# Patient Record
Sex: Female | Born: 1990 | Race: Black or African American | Hispanic: No | Marital: Single
Health system: Midwestern US, Community
[De-identification: ages and names within clinical notes are randomized; demographics above are authoritative.]

## PROBLEM LIST (undated history)

## (undated) DIAGNOSIS — R51 Headache: Secondary | ICD-10-CM

## (undated) DIAGNOSIS — R519 Headache, unspecified: Secondary | ICD-10-CM

## (undated) DIAGNOSIS — D649 Anemia, unspecified: Secondary | ICD-10-CM

## (undated) DIAGNOSIS — M2012 Hallux valgus (acquired), left foot: Secondary | ICD-10-CM

## (undated) DIAGNOSIS — M8970 Major osseous defect, unspecified site: Secondary | ICD-10-CM

## (undated) DIAGNOSIS — M2011 Hallux valgus (acquired), right foot: Secondary | ICD-10-CM

## (undated) DIAGNOSIS — O26893 Other specified pregnancy related conditions, third trimester: Secondary | ICD-10-CM

## (undated) DIAGNOSIS — O9A213 Injury, poisoning and certain other consequences of external causes complicating pregnancy, third trimester: Secondary | ICD-10-CM

## (undated) HISTORY — DX: Headache: R51

## (undated) HISTORY — DX: Headache, unspecified: R51.9

## (undated) MED ORDER — CEPHALEXIN 500 MG CAP
500 mg | ORAL_CAPSULE | Freq: Three times a day (TID) | ORAL | Status: AC
Start: ? — End: 2012-09-14

## (undated) MED ORDER — NAPROXEN 500 MG TAB
500 mg | ORAL_TABLET | Freq: Two times a day (BID) | ORAL | Status: AC
Start: ? — End: 2013-11-24

---
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
## 2011-10-07 MED ORDER — AZITHROMYCIN 250 MG TAB
250 mg | ORAL_TABLET | ORAL | Status: AC
Start: 2011-10-07 — End: 2011-10-12

## 2011-10-07 NOTE — ED Notes (Signed)
Pt reports sore throat for 2 weeks

## 2011-10-07 NOTE — ED Provider Notes (Addendum)
HPI Comments: 20 y.o. female presents ambulatory to ED C/O sore throat. Pt reports sore throat x 4 days which is worse with swallowing. Pt rates her pain as 6/10. Pt notes daughter has cold like sx's. Pt denies F/C, N/V/D, or cough.    PCP: Ricky Stabs, MD   Social Hx: -tobacco, -etoh  NKDA    There are no other complaints, changes or physical findings at this time.   Written by Joan Flores, ED Scribe, as dictated by Gillermina Phy.     The history is provided by the patient.        No past medical history on file.     No past surgical history on file.      Family History   Problem Relation Age of Onset   ??? Hypertension Mother    ??? Hypertension Maternal Grandfather         History     Social History   ??? Marital Status: Single     Spouse Name: N/A     Number of Children: N/A   ??? Years of Education: N/A     Occupational History   ??? Not on file.     Social History Main Topics   ??? Smoking status: Never Smoker    ??? Smokeless tobacco: Never Used   ??? Alcohol Use: No   ??? Drug Use: No   ??? Sexually Active: No     Other Topics Concern   ??? Not on file     Social History Narrative   ??? No narrative on file                  ALLERGIES: Review of patient's allergies indicates no known allergies.      Review of Systems   Constitutional: Negative.  Negative for fever and chills.   HENT: Positive for sore throat. Negative for congestion and rhinorrhea.    Eyes: Negative.    Respiratory: Negative.  Negative for cough and shortness of breath.    Cardiovascular: Negative.  Negative for chest pain.   Gastrointestinal: Negative.  Negative for nausea, vomiting and diarrhea.   Genitourinary: Negative.    Musculoskeletal: Negative.    Skin: Negative.  Negative for rash.   Neurological: Negative.    All other systems reviewed and are negative.        Filed Vitals:    10/07/11 1029   BP: 137/90   Pulse: 87   Temp: 97.6 ??F (36.4 ??C)   Resp: 16   Height: 5\' 2"  (1.575 m)   Weight: 69.8 kg (153 lb 14.1 oz)   SpO2: 100%             Physical Exam   Nursing note and vitals reviewed.  Constitutional: She is oriented to person, place, and time. She appears well-developed and well-nourished. No distress.   HENT:   Head: Normocephalic and atraumatic.   Right Ear: External ear normal.   Left Ear: External ear normal.   Nose: Nose normal.   Mouth/Throat: Posterior oropharyngeal erythema present. No oropharyngeal exudate.   Eyes: Conjunctivae and EOM are normal. Pupils are equal, round, and reactive to light. Right eye exhibits no discharge. Left eye exhibits no discharge. No scleral icterus.   Neck: Normal range of motion. Neck supple. No JVD present. No tracheal deviation present.   Cardiovascular: Normal rate, regular rhythm, normal heart sounds and intact distal pulses.  Exam reveals no gallop and no friction rub.    No  murmur heard.  Pulmonary/Chest: Effort normal and breath sounds normal. No respiratory distress. She has no wheezes. She has no rales. She exhibits no tenderness.   Abdominal: Soft. Bowel sounds are normal. She exhibits no distension and no mass. There is no tenderness. There is no rebound and no guarding.   Musculoskeletal: Normal range of motion. She exhibits no edema and no tenderness.   Lymphadenopathy:     She has no cervical adenopathy.   Neurological: She is alert and oriented to person, place, and time. She has normal reflexes. No cranial nerve deficit. She exhibits normal muscle tone. Coordination normal.   Skin: Skin is warm and dry. She is not diaphoretic.   Psychiatric: She has a normal mood and affect. Her behavior is normal. Judgment and thought content normal.    Written by Joan Flores, ED Scribe, as dictated by Gillermina Phy.     MDM     Progress:   Patient progress:  Stable      Procedures    10:37 AM    Pt has no new complaints, changes or physical findings. Care plan outlined and precautions discussed. All available results were reviewed with pt. All medications were reviewed with pt. Pt Rx'd Zithromax. All of pt's questions and concerns were addressed. Pt agrees to F/U as instructed with PCP and agrees to return to ED upon further deterioration. Pt is ready to go home.  Written by Joan Flores, ED Scribe, as dictated by Gillermina Phy.      I was personally available for consultation in the emergency department.  I have reviewed the chart and agree with the documentation recorded by the [facility], including the assessment, treatment plan, and disposition.  Julianne Handler, MD

## 2011-11-16 LAB — URINALYSIS W/MICROSCOPIC
Bilirubin: NEGATIVE
Blood: NEGATIVE
Glucose: NEGATIVE MG/DL
Ketone: NEGATIVE MG/DL
Nitrites: NEGATIVE
Specific gravity: 1.026 (ref 1.003–1.030)
Urobilinogen: 1 EU/DL (ref 0.2–1.0)
pH (UA): 8 (ref 5.0–8.0)

## 2011-11-16 LAB — HCG URINE, QL: HCG urine, QL: POSITIVE — AB

## 2011-11-16 MED ORDER — PROMETHAZINE 25 MG TAB
25 mg | ORAL_TABLET | Freq: Four times a day (QID) | ORAL | Status: DC | PRN
Start: 2011-11-16 — End: 2012-09-07

## 2011-11-16 MED ORDER — NITROFURANTOIN (25% MACROCRYSTAL FORM) 100 MG CAP
100 mg | ORAL_CAPSULE | Freq: Two times a day (BID) | ORAL | Status: AC
Start: 2011-11-16 — End: 2011-11-21

## 2011-11-16 NOTE — ED Provider Notes (Signed)
The history is provided by the patient. No language interpreter was used.      Pt is 20 yof ambulatory to ER with c/o nausea ongoing x [redacted] week along with h/a. Pt states "I have been feeling sick for a week". Pt denies vomiting or diarrhea. Pt denies fever/chills, cough,URI s/s,  urinary s/s, back pain, ABD cramping, vag bleeding, vag odor, vag d/c.     Pt states has taken no HCG test. Pt prior to today: G3 P1 A2 (elective abortions with most recent 8/12). Pt states LMP x 2 months ago.     Pt has + prenatal vits at home but not recently taking them .    Pt with no other c/o or concerns today. Pain is 0/10 scale.     Pt PMD none   OB /GYN: Dr Montez Hageman    Pt with NKA  Written by Mickey Farber, ED Scribe, as dictated by PA Cleavenger.        No past medical history on file.     No past surgical history on file.      Family History   Problem Relation Age of Onset   ??? Hypertension Mother    ??? Hypertension Maternal Grandfather         History     Social History   ??? Marital Status: SINGLE     Spouse Name: N/A     Number of Children: N/A   ??? Years of Education: N/A     Occupational History   ??? Not on file.     Social History Main Topics   ??? Smoking status: Never Smoker    ??? Smokeless tobacco: Never Used   ??? Alcohol Use: No   ??? Drug Use: No   ??? Sexually Active: No     Other Topics Concern   ??? Not on file     Social History Narrative   ??? No narrative on file                  ALLERGIES: Review of patient's allergies indicates no known allergies.      Review of Systems   Constitutional: Negative for fever and chills.        Well hydrated, in no apparent distress   HENT: Negative for ear pain, sore throat and rhinorrhea.    Eyes: Negative.    Respiratory: Negative for cough.    Cardiovascular: Negative.    Gastrointestinal: Positive for nausea. Negative for vomiting, abdominal pain and diarrhea.   Genitourinary: Negative for dysuria, vaginal bleeding and vaginal discharge.        Denies vag odor   Musculoskeletal: Negative for back  pain.   Skin:        + skin intact   Neurological:        + h/a, + A & O X 3 during exam    All other systems reviewed and are negative.        Filed Vitals:    11/16/11 1000   BP: 140/76   Pulse: 91   Temp: 98.2 ??F (36.8 ??C)   Resp: 18   Height: 5\' 2"  (1.575 m)   Weight: 68.312 kg (150 lb 9.6 oz)   SpO2: 100%            Physical Exam   Nursing note and vitals reviewed.  Constitutional: She is oriented to person, place, and time. She appears well-developed and well-nourished. No distress.  AA female   HENT:   Head: Normocephalic and atraumatic.   Eyes: EOM are normal. Pupils are equal, round, and reactive to light.   Neck: Normal range of motion. Neck supple.   Cardiovascular: Normal rate, regular rhythm, normal heart sounds and intact distal pulses.  Exam reveals no friction rub.    No murmur heard.  Pulmonary/Chest: Effort normal and breath sounds normal. No respiratory distress. She has no wheezes. She has no rales. She exhibits no tenderness.   Abdominal: Soft. Bowel sounds are normal. She exhibits no distension. There is no tenderness. There is no rebound and no guarding.        No CVAT   Musculoskeletal: Normal range of motion. She exhibits no edema and no tenderness.   Neurological: She is alert and oriented to person, place, and time. She exhibits normal muscle tone. Coordination normal.   Skin: Skin is warm and dry. She is not diaphoretic. No pallor.   Psychiatric: She has a normal mood and affect. Her behavior is normal.        MDM     Differential Diagnosis; Clinical Impression; Plan:     Pregnancy, gastritis, UTI  Amount and/or Complexity of Data Reviewed:   Clinical lab tests:  Ordered and reviewed  Tests in the medicine section of the CPT??:  Reviewed and ordered  Progress:   Patient progress:  Stable      Procedures    12:29 PM  Pt has been reexamined. Pt is feeling improved. Lab results reviewed with pt. RX meds explained to pt. Pt advised to f/u with Dr Montez Hageman x 2 days and to begin prenatal  vits. Pt questions answered. Pt counseled re: dx, tx, rx and f/u with understanding Pt is ready to go home.  Written by Mickey Farber, ED Scribe, as dictated by PA Cleavenger.

## 2011-11-16 NOTE — ED Notes (Signed)
PA at bedside.

## 2011-11-16 NOTE — ED Notes (Signed)
Assumed care of pt.  Assessment completed.  Pt placed in position of comfort.  Call bell in reach.  Pt reports nausea x more than 1 week.  Pt states unsure of when last menstrual period was.  Had an abortion in August 2012 and had a period after that.

## 2011-11-16 NOTE — ED Notes (Signed)
Adrian Cleavenger, PA reviewed discharge instructions with the patient.  The patient verbalized understanding.  Pt discharged via ambulation.  Instructions in hand.  Instructions given by Adrian Cleavenger, PA.

## 2011-11-17 NOTE — ED Provider Notes (Signed)
I was personally available for consultation in the emergency department.  I have reviewed the chart and agree with the documentation recorded by the MLP, including the assessment, treatment plan, and disposition.  Allura Doepke A Azka Steger, MD

## 2012-09-07 LAB — URINALYSIS W/ REFLEX CULTURE
Bilirubin: NEGATIVE
Glucose: NEGATIVE MG/DL
Ketone: NEGATIVE MG/DL
Nitrites: NEGATIVE
Protein: NEGATIVE MG/DL
Specific gravity: 1.012 (ref 1.003–1.030)
Urobilinogen: 0.2 EU/DL (ref 0.2–1.0)
pH (UA): 6.5 (ref 5.0–8.0)

## 2012-09-07 LAB — HCG URINE, QL. - POC: Pregnancy test,urine (POC): POSITIVE — AB

## 2012-09-07 NOTE — ED Notes (Signed)
Urine obtained and sent to lab ,POC Preg is +

## 2012-09-07 NOTE — ED Provider Notes (Signed)
HPI Comments: Amanda Little is a 21 y.o. female who presents ambulatory to [facility] ED with cc of request for pregnancy test. Pt reports her LNMP x 3 months and notes associated moderate BL "cramping" low abd pain. Pt denies visiting a OBGYN regularly and has not called an OBGYN regarding current sxs. Pt is unsure of who delivered her daughter. Pt notes she took 3 pregnancy tests at home, 1 positive and 2 negative. Pt denies vaginal bleeding, vaginal dicharge, problems with urine function or possibility of STD.     PCP: None    PMhx is significant for: Pt denies   Social hx: - Smoke, - EtOH, - Illicit Drugs    There are no other complaints, changes or physical findings at this time. 9:04 AM   Written by Sherlyn Lees, ED Scribe, as dictated by Annita Brod.        The history is provided by the patient.        History reviewed. No pertinent past medical history.     History reviewed. No pertinent past surgical history.      Family History   Problem Relation Age of Onset   ??? Hypertension Mother    ??? Hypertension Maternal Grandfather         History     Social History   ??? Marital Status: SINGLE     Spouse Name: N/A     Number of Children: N/A   ??? Years of Education: N/A     Occupational History   ??? Not on file.     Social History Main Topics   ??? Smoking status: Never Smoker    ??? Smokeless tobacco: Never Used   ??? Alcohol Use: No   ??? Drug Use: No   ??? Sexually Active: No     Other Topics Concern   ??? Not on file     Social History Narrative   ??? No narrative on file                  ALLERGIES: Review of patient's allergies indicates no known allergies.      Review of Systems   Constitutional: Negative.    HENT: Negative.    Eyes: Negative.    Respiratory: Negative.    Cardiovascular: Negative.    Gastrointestinal: Positive for abdominal pain (BL low).   Genitourinary: Negative.  Negative for dysuria, frequency, hematuria, vaginal bleeding, vaginal discharge and difficulty urinating.   Musculoskeletal: Negative.     Skin: Negative.    Neurological: Negative.    Hematological: Negative.    Psychiatric/Behavioral: Negative.    All other systems reviewed and are negative.        Filed Vitals:    09/07/12 0852   BP: 129/79   Pulse: 114   Temp: 98.3 ??F (36.8 ??C)   Resp: 18   Height: 5\' 2"  (1.575 m)   Weight: 66.5 kg (146 lb 9.7 oz)   SpO2: 100%            Physical Exam   Nursing note and vitals reviewed.  Constitutional: She is oriented to person, place, and time. She appears well-developed and well-nourished. No distress.   HENT:   Head: Normocephalic and atraumatic.   Right Ear: External ear normal.   Left Ear: External ear normal.   Nose: Nose normal.   Mouth/Throat: Oropharynx is clear and moist. No oropharyngeal exudate.   Eyes: Conjunctivae and EOM are normal. Pupils are equal, round, and reactive to light.  Right eye exhibits no discharge. Left eye exhibits no discharge. No scleral icterus.   Neck: Normal range of motion. Neck supple. No JVD present. No tracheal deviation present.   Cardiovascular: Normal rate, regular rhythm, normal heart sounds and intact distal pulses.  Exam reveals no gallop and no friction rub.    No murmur heard.  Pulmonary/Chest: Effort normal and breath sounds normal. No respiratory distress. She has no wheezes. She has no rales. She exhibits no tenderness.   Abdominal: Soft. Bowel sounds are normal. She exhibits no distension and no mass. There is no tenderness. There is no rigidity, no rebound and no guarding.   Musculoskeletal: Normal range of motion. She exhibits no edema and no tenderness.   Lymphadenopathy:     She has no cervical adenopathy.   Neurological: She is alert and oriented to person, place, and time. She has normal reflexes. No cranial nerve deficit. She exhibits normal muscle tone. Coordination normal.   Skin: Skin is warm and dry. She is not diaphoretic.   Psychiatric: She has a normal mood and affect. Her behavior is normal. Judgment and thought content normal.    Written by  Andreas Blower, ED Scribe, as dictated by Annita Brod.        MDM     Differential Diagnosis; Clinical Impression; Plan:     DDx: Pregnancy, Amenorrhea, UTI   Amount and/or Complexity of Data Reviewed:   Clinical lab tests:  Reviewed and ordered   Review and summarize past medical records:  Yes  Progress:   Patient progress:  Stable      Procedures    LABORATORY TESTS:  Recent Results (from the past 12 hour(s))   URINALYSIS W/ REFLEX CULTURE    Collection Time    09/07/12  9:30 AM       Result Value Range    Color YELLOW/STRAW      Appearance CLEAR  CLEAR    Specific gravity 1.012  1.003 - 1.030      pH 6.5  5.0 - 8.0      Protein NEGATIVE   NEGATIVE MG/DL    Glucose NEGATIVE   NEGATIVE MG/DL    Ketone NEGATIVE   NEGATIVE MG/DL    Bilirubin NEGATIVE   NEGATIVE    Blood TRACE (*) NEGATIVE    Urobilinogen 0.2  0.2 - 1.0 EU/DL    Nitrites NEGATIVE   NEGATIVE    Leukocyte Esterase SMALL (*) NEGATIVE    WBC 5-10  0 - 4 /HPF    RBC 0-5  0 - 5 /HPF    Epithelial cells MODERATE (*) FEW /LPF    Bacteria 1+ (*) NEGATIVE /HPF    UA:UC IF INDICATED URINE CULTURE ORDERED (*) CULTURE NOT INDICATE   HCG URINE, QL. - POC    Collection Time    09/07/12  9:48 AM       Result Value Range    Pregnancy test,urine (POC) POSITIVE (*) NEGATIVE       MEDICATIONS GIVEN:  Medications - No data to display    IMPRESSION:  1. Pregnancy    2. UTI (urinary tract infection)        PLAN:  1. Keflex  2. F/u with OBGYN  Return to ED if worse     DISCHARGE NOTE  10:30 AM    Brand Males is ready for discharge. The pt's signs/symptoms, results, diagnosis and discharge instructions have been discussed w/ the pt and/or available family members. The pt conveys understanding  and agreement with the diagnosis and plan. There are no new physical findings, changes or complaints at this time. Pt is recommended to f/u with OBGYN or return to ED if condition worsens. Will discharge w/ Keflex.  Written by Andreas Blower, ED Scribe, as dictated by  Annita Brod.

## 2012-09-07 NOTE — Other (Signed)
PAC reviewed discharge instructions with the patient.  The patient verbalized understanding.

## 2012-09-07 NOTE — ED Notes (Signed)
Pt A&Ox4 with discharge, pt ambulatory out with discharge with no complications. No further distress noted with discharge.

## 2012-09-07 NOTE — ED Notes (Signed)
Patient has been given discharge instructions by PA and patient was given the opportunity to ask questions.  Nurse also reinforced discharge instructions and patient has verbalized understanding.

## 2012-09-07 NOTE — ED Provider Notes (Signed)
I was personally available for consultation in the emergency department.  I have reviewed the chart and agree with the documentation recorded by the MLP, including the assessment, treatment plan, and disposition.  Bascom Biel, MD

## 2012-09-08 LAB — CULTURE, URINE
Colonies Counted: <10000
Colony Count: <10000
Culture result:: NO GROWTH
Culture: NO GROWTH

## 2013-03-14 MED ORDER — OFLOXACIN 0.3 % EAR DROPS
0.3 % | Freq: Every day | OTIC | Status: DC
Start: 2013-03-14 — End: 2014-04-13

## 2013-03-14 NOTE — ED Notes (Signed)
Assumed care of patient ambulatory from waiting room.  Patient reports cough cold congestion x 1 week with 3 day hx of decreased hearing from R ear. Denies any fever/chills Reports nausea  Patient alert and oriented x 4 Skin warm dry intact  Patient in POC on stretcher Updated on plan with pending evaluation by PA/NP

## 2013-03-14 NOTE — ED Notes (Signed)
Patient discharged by NP Page. Patient provided with discharge instructions Rx and instructions on follow up care. Patient out of ED under own power with steady gait accompanied by self.

## 2013-03-14 NOTE — ED Provider Notes (Addendum)
HPI Comments: 22 y.o. female presents ambulatory to the ED c/o sinus congestion and drainage x 1 week. Pt reports rhinorrhea and states she has tx'd her symptoms with Dayquil and Mucinex with little relief. Pt reports her symptoms are worse in the morning after waking and states she has a congested cough in the morning. Pt denies SOB, wheezing Fever or Chills. Pt denies modifying factors. Pt also reports fullness in her R ear. Pt denies h/o allergies. Pt denies abd pain, N/V/D, HA, rash. Pt denies pain associated with symptoms.     PCP: None  PMHx: pt denies   PSHx: pt denies   Social Hx: non smoker     There are no other complaints, changes or physical findings at this time.   Written by Kathlene Cote, ED Scribe, as dictated by Mani Bales?? FNP-BC        The history is provided by the patient. No language interpreter was used.        History reviewed. No pertinent past medical history.     History reviewed. No pertinent past surgical history.      Family History   Problem Relation Age of Onset   ??? Hypertension Mother    ??? Hypertension Maternal Grandfather         History     Social History   ??? Marital Status: SINGLE     Spouse Name: N/A     Number of Children: N/A   ??? Years of Education: N/A     Occupational History   ??? Not on file.     Social History Main Topics   ??? Smoking status: Never Smoker    ??? Smokeless tobacco: Never Used   ??? Alcohol Use: No   ??? Drug Use: No   ??? Sexually Active: No     Other Topics Concern   ??? Not on file     Social History Narrative   ??? No narrative on file                  ALLERGIES: Review of patient's allergies indicates no known allergies.      Review of Systems   Constitutional: Negative.  Negative for fever and chills.   HENT: Positive for ear pain (see hpi, R ear fullness), congestion, rhinorrhea, sneezing and sinus pressure. Negative for sore throat.    Eyes: Negative.    Respiratory: Positive for cough. Negative for chest tightness, shortness of breath and wheezing.     Cardiovascular: Negative.    Gastrointestinal: Negative.  Negative for nausea, vomiting, abdominal pain and diarrhea.   Endocrine: Negative.    Genitourinary: Negative.    Musculoskeletal: Negative.  Negative for myalgias and back pain.   Skin: Negative.  Negative for rash and wound.   Allergic/Immunologic: Negative.  Negative for environmental allergies.   Neurological: Negative.    All other systems reviewed and are negative.        Filed Vitals:    03/14/13 1308   BP: 134/91   Pulse: 86   Temp: 98.2 ??F (36.8 ??C)   Resp: 18   Height: 5\' 2"  (1.575 m)   Weight: 65.7 kg (144 lb 13.5 oz)   SpO2: 97%            Physical Exam   Nursing note and vitals reviewed.  Constitutional: She is oriented to person, place, and time. Vital signs are normal. She appears well-developed and well-nourished.  Non-toxic appearance. She does not have a sickly appearance.  She does not appear ill. No distress.   HENT:   Head: Normocephalic and atraumatic.   Mouth/Throat: No oropharyngeal exudate.   L TM is dove gray and canal is unobstructed. R ear canal is impacted with cerumen. Pale turbinates BL. Pale mucous membranes in oropharynx.    Eyes: Conjunctivae, EOM and lids are normal. Pupils are equal, round, and reactive to light.   Neck: Trachea normal, normal range of motion and full passive range of motion without pain. Neck supple.   Cardiovascular: Normal rate, regular rhythm, normal heart sounds and normal pulses.    Pulmonary/Chest: Effort normal and breath sounds normal. She has no wheezes. She has no rales.   Abdominal: Soft. Normal appearance and bowel sounds are normal.   Musculoskeletal: Normal range of motion.   Neurological: She is alert and oriented to person, place, and time. She has normal strength. GCS eye subscore is 4. GCS verbal subscore is 5. GCS motor subscore is 6.   Skin: Skin is warm, dry and intact.   Psychiatric: She has a normal mood and affect. Her speech is normal and behavior is normal. Judgment and thought  content normal. Cognition and memory are normal.   Written by Kathlene Cote, ED Scribe, dictated by Raso Bales?? FNP-BC      MDM     Differential Diagnosis; Clinical Impression; Plan:     DDx: otitis media, otitis externa, viral illness, seasonal allergies   Amount and/or Complexity of Data Reviewed:    Decide to obtain previous medical records or to obtain history from someone other than the patient:  No   Obtain history from someone other than the patient:  No   Review and summarize past medical records:  No   Discuss the patient with another provider:  No   Independant visualization of image, tracing, or specimen:  No  Risk of Significant Complications, Morbidity, and/or Mortality:   Presenting problems:  Minimal  Diagnostic procedures:  Minimal  Management options:  Minimal  Progress:   Patient progress:  Stable      Procedures    Progress Note:  2:55 PM  Pt re-evaluated. The RN was able to irrigate the the pt's R ear canal and clear the cerumen. The TM appears bulging with no erythema. .   Written by Kathlene Cote, ED Scribe, dictated by Caisse Bales?? FNP-BC      IMPRESSION:  1. Ear pain, right    2. Otitis externa    3. Nasal congestion        PLAN:  1. F/u with a PCP or local clinic   2. Rx: Floxin   Return to ED if worse       3:00 PM  Amanda Little's  results have been reviewed with her.  She has been counseled regarding her diagnosis.  She verbally conveys understanding and agreement of the signs, symptoms, diagnosis, treatment and prognosis and additionally agrees to follow up as recommended.  She also agrees with the care-plan and conveys that all of her questions have been answered.  I have also put together some discharge instructions for her that include: 1) educational information regarding their diagnosis, 2) how to care for their diagnosis at home, as well a 3) list of reasons why they would want to return to the ED prior to their follow-up appointment, should their condition change.  Written  by Kathlene Cote, ED Scribe, dictated by Kettles Bales?? FNP-BC    I was personally available for consultation in  the emergency department.  I have reviewed the chart and agree with the documentation recorded by the [facility], including the assessment, treatment plan, and disposition.  Alden Benjamin, MD

## 2013-03-14 NOTE — ED Notes (Signed)
NP Page bedside evaluating patient.

## 2013-03-14 NOTE — ED Notes (Signed)
Flushed the patients right eat with saline, removed a large amount of waxy debris

## 2013-11-14 NOTE — ED Provider Notes (Addendum)
HPI Comments: Amanda Little is a 23 y.o. female presenting ambulatory to ED c/o 5/10 throbbing R wrist pain with bruising since yesterday secondary to injury. Pt reports exacerbation of sxs with movement. Pt states that she hit her wrist on a dresser while moving. Pt reports applying ice and taking Tylenol with no relief of sxs. Pt denies chance of pregnancy. Pt specifically denies numbness, CP, SOB, ABD pain, or N/V/D.     PMHx Significant For: Pt denies  PSHx Significant For: Pt denies  Social Hx: - tobacco (never), - EtOH, - illicit drugs      There are no other complaints, changes or physical findings at this time.     Written by Philis Nettle Inamdar, ED Scribe, as dictated by Dede Query, PA-C.     The history is provided by the patient.        History reviewed. No pertinent past medical history.     History reviewed. No pertinent past surgical history.      Family History   Problem Relation Age of Onset   ??? Hypertension Mother    ??? Hypertension Maternal Grandfather         History     Social History   ??? Marital Status: SINGLE     Spouse Name: N/A     Number of Children: N/A   ??? Years of Education: N/A     Occupational History   ??? Not on file.     Social History Main Topics   ??? Smoking status: Never Smoker    ??? Smokeless tobacco: Never Used   ??? Alcohol Use: No   ??? Drug Use: No   ??? Sexually Active: No     Other Topics Concern   ??? Not on file     Social History Narrative   ??? No narrative on file                  ALLERGIES: Review of patient's allergies indicates no known allergies.      Review of Systems   Constitutional: Negative.  Negative for fever and chills.   HENT: Negative.  Negative for ear pain, congestion, sore throat and rhinorrhea.    Eyes: Negative.  Negative for visual disturbance.   Respiratory: Negative.  Negative for cough, chest tightness, shortness of breath and wheezing.    Cardiovascular: Negative.  Negative for chest pain and palpitations.   Gastrointestinal: Negative.  Negative for nausea,  vomiting, abdominal pain, diarrhea and constipation.   Endocrine: Negative.    Genitourinary: Negative.  Negative for dysuria and hematuria.   Musculoskeletal: Positive for arthralgias. Negative for myalgias.   Skin: Positive for color change (bruising). Negative for rash.   Allergic/Immunologic: Negative.  Negative for environmental allergies and food allergies.   Neurological: Negative.  Negative for seizures and headaches.   Hematological: Negative.    Psychiatric/Behavioral: Negative.    All other systems reviewed and are negative.        Filed Vitals:    11/14/13 1845 11/14/13 2016   BP: 130/92 137/88   Pulse: 99 92   Temp: 98.3 ??F (36.8 ??C)    Resp: 18 16   Height: 5\' 2"  (1.575 m)    Weight: 65.2 kg (143 lb 11.8 oz)    SpO2: 100% 97%            Physical Exam   Nursing note and vitals reviewed.  Constitutional: She is oriented to person, place, and time. She appears well-developed and well-nourished.  No distress.   Pt appears well, sitting on bed, awake and alert in NAD.    HENT:   Head: Normocephalic and atraumatic.   Right Ear: Tympanic membrane, external ear and ear canal normal.   Left Ear: Tympanic membrane, external ear and ear canal normal.   Nose: Nose normal.   Mouth/Throat: Uvula is midline, oropharynx is clear and moist and mucous membranes are normal. No oropharyngeal exudate, posterior oropharyngeal edema or posterior oropharyngeal erythema.   Eyes: Conjunctivae and EOM are normal. Pupils are equal, round, and reactive to light. Right eye exhibits no discharge. Left eye exhibits no discharge. No scleral icterus.   Neck: Normal range of motion. Neck supple. No JVD present. No tracheal deviation present.   Cardiovascular: Normal rate, regular rhythm, normal heart sounds and intact distal pulses.  Exam reveals no gallop and no friction rub.    No murmur heard.  Pulses:       Radial pulses are 2+ on the right side, and 2+ on the left side.   2+ radial pulses b/l.  Good cap refills less than 2 seconds    Pulmonary/Chest: Effort normal and breath sounds normal. No stridor. No respiratory distress. She has no wheezes. She has no rales. She exhibits no tenderness.   Abdominal: Soft. Bowel sounds are normal. She exhibits no distension. There is no tenderness. There is no guarding.   No CVA tenderness b/l.   Musculoskeletal: Normal range of motion. She exhibits tenderness. She exhibits no edema.        Right wrist: She exhibits tenderness (over anterior surface). She exhibits normal range of motion.        Right hand: She exhibits tenderness (of anterior 5th metacarpal).   R wrist: + bruising over anterior wrist. No edema, erythema, or obvious bony deformity. + TTP over bruising. ROM grossly intact.  R hand: No edema, erythema, or obvious bony deformity. + TTP over anterior 5th metacarpal. ROM grossly intact.   Lymphadenopathy:     She has no cervical adenopathy.   Neurological: She is alert and oriented to person, place, and time. No cranial nerve deficit. She exhibits normal muscle tone. Coordination normal.   Sensation and neuro intact. Grip strength 5/5 of upper extremity b/l.   Skin: Skin is warm and dry. Bruising (over anterior surface of R wrist) noted. No rash noted. She is not diaphoretic. No erythema. No pallor.   Psychiatric: She has a normal mood and affect. Her behavior is normal. Judgment and thought content normal.   Written by Nirja A. Inamdar, ED Scribe, as dictated by Dede Query, PA-C.       MDM     Differential Diagnosis; Clinical Impression; Plan:     DDx: fx, sprain, strain, contusion  Amount and/or Complexity of Data Reviewed:   Tests in the radiology section of CPT??:  Ordered and reviewed   Review and summarize past medical records:  Yes   Independant visualization of image, tracing, or specimen:  Yes  Progress:   Patient progress:  Stable and improved      Procedures    Procedure Note - ACE Wrap Placement:  8:13 PM  Performed by: Dede Query, PA-C  Neurovascularly intact prior to tx.  An ACE  wrap bandage was placed on pt's right wrist.  Joint was placed in neutral position.  Neurovascularly intact after tx.   The procedure took 1-15 minutes, and pt tolerated well.  Written by Philis Nettle Inamdar, ED Scribe, as dictated by Dede Query,  PA-C.    IMAGING RESULTS:     XR WRIST RT AP/LAT/OBL (Final result)  Result time: 11/14/13 19:57:47      Final result by Rad Results In Edi (11/14/13 19:57:47)      Narrative:    **Final Report**      ICD Codes / Adm.Diagnosis: 140016 / Wrist Pain hand and wrist pain  Examination: CR WRIST MIN 3 VWS RT - [id number] - Nov 14 2013 7:43PM  Accession No: [id number]  Reason: injury      REPORT:  EXAM: CR WRIST MIN 3 VWS RT    INDICATION: Right wrist injury    COMPARISON: None.    FINDINGS: 4 views of the right wrist demonstrate no fracture. The soft   tissues are within normal limits.       IMPRESSION: No fracture detected.          Signing/Reading Doctor: Trilby LeaverBOBBETTE L. NEWSOME [phone]([id number]   Approved: Trilby LeaverBOBBETTE L. NEWSOME [phone]([id number]) Nov 14 2013 7:55PM                         XR HAND RT MIN 3 V (Final result)  Result time: 11/14/13 19:52:26      Final result by Rad Results In Edi (11/14/13 19:52:26)      Narrative:    **Final Report**      ICD Codes / Adm.Diagnosis: 140016 / Wrist Pain hand and wrist pain  Examination: CR HAND MIN 3 VWS RT - [id number] - Nov 14 2013 7:43PM  Accession No: [id number]  Reason: injury      REPORT:  EXAM: CR HAND MIN 3 VWS RT    INDICATION: Right hand injury    COMPARISON: None.    FINDINGS: Three views of the right hand demonstrate no fracture or   dislocation. The soft tissues are within normal limits.       IMPRESSION: No fracture detected.          Signing/Reading Doctor: Trilby LeaverBOBBETTE L. NEWSOME [phone]([id number]   Approved: Trilby LeaverBOBBETTE L. NEWSOME [phone]([id number]) Nov 14 2013 7:50PM        MEDICATIONS GIVEN:  Medications   oxyCODONE-acetaminophen (PERCOCET) 5-325 mg per tablet 1 tablet (1 tablet Oral Given 11/14/13 2014)       IMPRESSION:  1. Contusion of wrist, right    2. Wrist pain    3. Hand  pain        PLAN:   1. F/u with Dr. Logan BoresEvans in 4 days or PCP as needed  2. Rx Naprosyn  3. Rest, ice, compression, and elevation  4. Return to ED if worse     DISCHARGE NOTE:  8:15 PM   The patient is ready for discharge. The patient's signs, symptoms, diagnosis, and discharge instructions have been discussed and the patient and/or family has conveyed their understanding. The patient and/or family is to follow up as recommended or return to the ER should their symptoms worsen. Plan has been discussed and the patient and/or family is in agreement.   Written by Philis NettleNirja A. Inamdar, ED Scribe, as dictated by Dede QueryLacey Tudor, PA-C.

## 2013-11-14 NOTE — ED Notes (Signed)
PA Tudor reviewed discharge instructions with the patient.  The patient verbalized understanding. The patient was ambulatory out of ED in stable condition.

## 2013-11-14 NOTE — ED Notes (Signed)
Pt complain of pain to her right wrist after hitting while moving a dresser yesterday. Pt complain of pain 5/10. Pt took tylenol; with little relief.

## 2013-11-15 MED ORDER — OXYCODONE-ACETAMINOPHEN 5 MG-325 MG TAB
5-325 mg | ORAL | Status: AC
Start: 2013-11-15 — End: 2013-11-14
  Administered 2013-11-15: 01:00:00 via ORAL

## 2013-11-15 NOTE — ED Provider Notes (Signed)
I was personally available for consultation in the emergency department.  I have reviewed the chart and agree with the documentation recorded by the MLP, including the assessment, treatment plan, and disposition.  Antoniette Peake, MD

## 2014-03-24 LAB — RPR, EXTERNAL: RPR, External: NONREACTIVE

## 2014-03-24 LAB — GLUCOSE, GESTATIONAL, 1 HR TOLERANCE, EXTERNAL: Antibody screen, External: NEGATIVE

## 2014-03-24 LAB — RUBELLA AB, IGG, EXTERNAL: Rubella, External: IMMUNE

## 2014-03-24 LAB — HIV-1 AB, EXTERNAL
HIV, EXTERNAL: NONREACTIVE
HIV, External: NONREACTIVE

## 2014-03-24 LAB — HEPATITIS B SURFACE AG, EXTERNAL: HBsAg, External: NEGATIVE

## 2014-03-24 LAB — TYPE, ABO & RH, EXTERNAL
ABO,Rh: A POS
TYPE, ABO & RH, EXTERNAL: A POS

## 2014-04-13 LAB — CBC WITH AUTOMATED DIFF
ABS. BASOPHILS: 0 10*3/uL (ref 0.0–0.1)
ABS. EOSINOPHILS: 0 10*3/uL (ref 0.0–0.4)
ABS. LYMPHOCYTES: 1.7 10*3/uL (ref 0.8–3.5)
ABS. MONOCYTES: 0.4 10*3/uL (ref 0.0–1.0)
ABS. NEUTROPHILS: 6 10*3/uL (ref 1.8–8.0)
BASOPHILS: 0 % (ref 0–1)
EOSINOPHILS: 0 % (ref 0–7)
HCT: 36.8 % (ref 35.0–47.0)
HGB: 12.8 g/dL (ref 11.5–16.0)
LYMPHOCYTES: 21 % (ref 12–49)
MCH: 29.7 PG (ref 26.0–34.0)
MCHC: 34.8 g/dL (ref 30.0–36.5)
MCV: 85.4 FL (ref 80.0–99.0)
MONOCYTES: 5 % (ref 5–13)
NEUTROPHILS: 74 % (ref 32–75)
PLATELET: 251 10*3/uL (ref 150–400)
RBC: 4.31 M/uL (ref 3.80–5.20)
RDW: 12.9 % (ref 11.5–14.5)
WBC: 8.2 10*3/uL (ref 3.6–11.0)

## 2014-04-13 LAB — GLUCOSE, POC: Glucose (POC): 91 mg/dL (ref 65–100)

## 2014-04-13 LAB — URINALYSIS W/ REFLEX CULTURE
Blood: NEGATIVE
Glucose: NEGATIVE mg/dL
Ketone: 80 mg/dL — AB
Nitrites: NEGATIVE
Protein: 30 mg/dL — AB
Specific gravity: 1.015 (ref 1.003–1.030)
Urobilinogen: 1 EU/dL (ref 0.2–1.0)
pH (UA): 7.5 (ref 5.0–8.0)

## 2014-04-13 LAB — METABOLIC PANEL, COMPREHENSIVE
A-G Ratio: 0.9 — ABNORMAL LOW (ref 1.1–2.2)
ALT (SGPT): 39 U/L (ref 12–78)
AST (SGOT): 19 U/L (ref 15–37)
Albumin: 3.7 g/dL (ref 3.5–5.0)
Alk. phosphatase: 56 U/L (ref 45–117)
Anion gap: 7 mmol/L (ref 5–15)
BUN/Creatinine ratio: 7 — ABNORMAL LOW (ref 12–20)
BUN: 5 MG/DL — ABNORMAL LOW (ref 6–20)
Bilirubin, total: 0.6 MG/DL (ref 0.2–1.0)
CO2: 27 mmol/L (ref 21–32)
Calcium: 8.9 MG/DL (ref 8.5–10.1)
Chloride: 101 mmol/L (ref 97–108)
Creatinine: 0.75 MG/DL (ref 0.45–1.15)
GFR est AA: >60 mL/min/{1.73_m2} (ref >60)
GFR est non-AA: >60 mL/min/{1.73_m2} (ref >60)
Globulin: 4.1 g/dL — ABNORMAL HIGH (ref 2.0–4.0)
Glucose: 76 mg/dL (ref 65–100)
Potassium: 3.3 mmol/L — ABNORMAL LOW (ref 3.5–5.1)
Protein, total: 7.8 g/dL (ref 6.4–8.2)
Sodium: 135 mmol/L — ABNORMAL LOW (ref 136–145)

## 2014-04-13 LAB — BILIRUBIN, CONFIRM: Bilirubin UA, confirm: NEGATIVE

## 2014-04-13 LAB — LIPASE: Lipase: 92 U/L (ref 73–393)

## 2014-04-13 MED ORDER — CEPHALEXIN 500 MG CAP
500 mg | ORAL_CAPSULE | Freq: Three times a day (TID) | ORAL | Status: AC
Start: 2014-04-13 — End: 2014-04-20

## 2014-04-13 MED ORDER — FAMOTIDINE (PF) 20 MG/2 ML IV
20 mg/2 mL | INTRAVENOUS | Status: AC
Start: 2014-04-13 — End: 2014-04-13
  Administered 2014-04-13: 18:00:00 via INTRAVENOUS

## 2014-04-13 MED ORDER — ONDANSETRON 4 MG TAB, RAPID DISSOLVE
4 mg | ORAL_TABLET | Freq: Three times a day (TID) | ORAL | Status: DC | PRN
Start: 2014-04-13 — End: 2014-10-05

## 2014-04-13 MED ORDER — ONDANSETRON (PF) 4 MG/2 ML INJECTION
4 mg/2 mL | INTRAMUSCULAR | Status: AC
Start: 2014-04-13 — End: 2014-04-13
  Administered 2014-04-13: 17:00:00 via INTRAVENOUS

## 2014-04-13 MED ORDER — SODIUM CHLORIDE 0.9% BOLUS IV
0.9 % | INTRAVENOUS | Status: AC
Start: 2014-04-13 — End: 2014-04-13
  Administered 2014-04-13: 19:00:00 via INTRAVENOUS

## 2014-04-13 MED ORDER — CEPHALEXIN 500 MG CAP
500 mg | ORAL_CAPSULE | Freq: Three times a day (TID) | ORAL | Status: DC
Start: 2014-04-13 — End: 2014-04-13

## 2014-04-13 MED ORDER — CEFTRIAXONE 1 GRAM SOLUTION FOR INJECTION
1 gram | INTRAMUSCULAR | Status: AC
Start: 2014-04-13 — End: 2014-04-13
  Administered 2014-04-13: 19:00:00 via INTRAVENOUS

## 2014-04-13 MED ORDER — ONDANSETRON 4 MG TAB, RAPID DISSOLVE
4 mg | ORAL_TABLET | Freq: Three times a day (TID) | ORAL | Status: DC | PRN
Start: 2014-04-13 — End: 2014-04-13

## 2014-04-13 MED ORDER — POTASSIUM CHLORIDE SR 10 MEQ TAB
10 mEq | ORAL | Status: AC
Start: 2014-04-13 — End: 2014-04-13
  Administered 2014-04-13: 19:00:00 via ORAL

## 2014-04-13 MED ORDER — PROMETHAZINE 25 MG RECTAL SUPPOSITORY
25 mg | Freq: Four times a day (QID) | RECTAL | Status: DC | PRN
Start: 2014-04-13 — End: 2014-10-05

## 2014-04-13 MED ORDER — PROMETHAZINE 25 MG RECTAL SUPPOSITORY
25 mg | Freq: Four times a day (QID) | RECTAL | Status: DC | PRN
Start: 2014-04-13 — End: 2014-04-13

## 2014-04-13 MED ORDER — ONDANSETRON (PF) 4 MG/2 ML INJECTION
4 mg/2 mL | INTRAMUSCULAR | Status: AC
Start: 2014-04-13 — End: 2014-04-13
  Administered 2014-04-13: 20:00:00 via INTRAVENOUS

## 2014-04-13 MED ADMIN — sodium chloride 0.9 % bolus infusion 1,000 mL: INTRAVENOUS | @ 17:00:00 | NDC 00409798309

## 2014-04-13 MED FILL — SODIUM CHLORIDE 0.9 % IV: INTRAVENOUS | Qty: 1000

## 2014-04-13 MED FILL — K-TAB 10 MEQ TABLET,EXTENDED RELEASE: 10 mEq | ORAL | Qty: 4

## 2014-04-13 MED FILL — CEFTRIAXONE 1 GRAM SOLUTION FOR INJECTION: 1 gram | INTRAMUSCULAR | Qty: 1

## 2014-04-13 MED FILL — FAMOTIDINE (PF) 20 MG/2 ML IV: 20 mg/2 mL | INTRAVENOUS | Qty: 2

## 2014-04-13 MED FILL — ONDANSETRON (PF) 4 MG/2 ML INJECTION: 4 mg/2 mL | INTRAMUSCULAR | Qty: 2

## 2014-04-13 NOTE — ED Notes (Signed)
Pt is having slight nausea and headache but she thinks she can try and keep applesauce down.

## 2014-04-13 NOTE — ED Provider Notes (Addendum)
HPI Comments: Amanda Little is a 23 y.o. female G7P1A (2-ab, 3-miss) approximaly [redacted] weeks pregnant presents to emergency room c/o multiple episodes of vomiting since pregnancy. She notes sx's are similar to previous hyperemesis which she experienced with her previous pregnancy. She has been taking prenatal vitamins and promethazine however states she vomited her promethazine this morning. She states 2 days ago she felt 'hot', stood up fast and had a syncopal episode and struck her head on the TV and awoke seconds later by father. She denies visual changes, numbness/tingling and states having a dull occipital HA "that isn't very bad" and not a pain source of discomfort for her.She states she called EMS today because she felt lightheaded with standing and weak and notes some 'blood streaks' in clear vomit. Her glucose was found to be 69 and she was given oral glucose en route to ED, she state she consumed about half the 15g tube, no hx of hypoglycemia in the past. She denies drinking alcohol, denies tobacco use, denies hx of gastritis / ulcer. She denies vaginal bleeding/spotting, F/C, diarrhea, cough, congestion, back pain, urinary / bowel complaints, abd pain, flank pain. She saw her OB last on 5/29 and had a confirmed IUP via [place] and states she was ~[redacted] weeks gestation.  LMP- 4/7 (approximate)    OB: Doreatha Martin, [facility]  PMH- none  Surgical hx- c-section  Social hx- denies tobacco, denies ETOH    The patient has no other complaints at this time.    Written by Chaya Jan, PA-C        Patient is a 23 y.o. female presenting with nausea. The history is provided by the patient.   Nausea   Pertinent negatives include no chills, no fever, no abdominal pain, no diarrhea and no myalgias.        No past medical history on file.     No past surgical history on file.      Family History   Problem Relation Age of Onset   ??? Hypertension Mother    ??? Hypertension Maternal Grandfather         History     Social  History   ??? Marital Status: SINGLE     Spouse Name: N/A     Number of Children: N/A   ??? Years of Education: N/A     Occupational History   ??? Not on file.     Social History Main Topics   ??? Smoking status: Never Smoker    ??? Smokeless tobacco: Never Used   ??? Alcohol Use: No   ??? Drug Use: No   ??? Sexual Activity: No     Other Topics Concern   ??? Not on file     Social History Narrative                  ALLERGIES: Review of patient's allergies indicates no known allergies.      Review of Systems   Constitutional: Positive for appetite change. Negative for fever and chills.   HENT: Negative.    Respiratory: Negative.    Cardiovascular: Negative.    Gastrointestinal: Positive for nausea and vomiting. Negative for abdominal pain, diarrhea and constipation.   Genitourinary: Negative.    Musculoskeletal: Negative.  Negative for myalgias, back pain and neck pain.   Skin: Negative for rash.   All other systems reviewed and are negative.      Filed Vitals:    04/13/14 1201 04/13/14 1258 04/13/14 1358  04/13/14 1428   BP: 129/75 117/68 127/74 106/77   Pulse: 72 83     Temp: 98.1 ??F (36.7 ??C)      Resp: 18      Height: '5\' 1"'  (1.549 m)      Weight: 58.968 kg (130 lb)      SpO2: 100%  100% 100%            Physical Exam   Constitutional: She is oriented to person, place, and time. She appears well-developed and well-nourished. She is active.  Non-toxic appearance. No distress.   Thin AA female, sitting upright in no distress   HENT:   Head: Normocephalic and atraumatic.   Right Ear: External ear normal.   Left Ear: External ear normal.   Nose: Nose normal.   Mouth/Throat: Oropharynx is clear and moist. No oropharyngeal exudate.   Eyes: Conjunctivae are normal.   Neck: Normal range of motion and full passive range of motion without pain. Neck supple. No tracheal tenderness present.   Cardiovascular: Normal rate, regular rhythm, S1 normal, S2 normal, normal heart sounds, intact distal pulses and normal pulses.  Exam reveals no gallop and  no friction rub.    No murmur heard.  Pulmonary/Chest: Effort normal and breath sounds normal. No respiratory distress. She has no wheezes. She has no rales.   Abdominal: Soft. Bowel sounds are normal. She exhibits no distension. There is no tenderness. There is no rebound and no guarding.   No CVAT   Musculoskeletal: Normal range of motion. She exhibits no edema or tenderness.   Neurological: She is alert and oriented to person, place, and time. She has normal strength. No cranial nerve deficit or sensory deficit. Coordination normal.   Skin: Skin is warm, dry and intact. No abrasion and no rash noted. She is not diaphoretic. No erythema.   Psychiatric: She has a normal mood and affect. Her speech is normal and behavior is normal. Cognition and memory are normal.   Nursing note and vitals reviewed.       MDM  Number of Diagnoses or Management Options  Acute cystitis without hematuria:   Hypokalemia:   Nausea with vomiting:   Diagnosis management comments: Ddx: hyperemesis gravidarum, UTI, pyelo, gastritis    Patient has no fever, no leukocytosis, is tolerating PO, appears well. Not orthostatic. Gave Rocephin in ED, 2L NS. No pelvic bleeding/pain/discharge or pelvic complaints. Has had confirmed IUP by OB. Patient tolerating chicken, broccoli and Kdur.  Chaya Jan, PA-C         Amount and/or Complexity of Data Reviewed  Clinical lab tests: ordered and reviewed  Review and summarize past medical records: yes    Patient Progress  Patient progress: stable      Procedures       Procedure Note - Limited Bedside Ultrasound- Pregnancy, FHT  3:49 PM  Performed by: Antonieta Pert, DO  Indication: Pregnancy  Interpretation:  Pt with Positive IUP noted on Transabdominal bedside US showing Positive fetal cardiac flicker.  The procedure took 1-15 minutes, and pt tolerated well.   Written by Anice Paganini, ED Scribe, as dictated by Antonieta Pert, DO.      DISCHARGE NOTE:  4:28 PM  The patient's results have been  reviewed with them and/or available family. Patient and/or family verbally conveyed their understanding and agreement of the patient's signs, symptoms, diagnosis, treatment and prognosis and additionally agree to follow up as recommended in the discharge instructions or to return to the  Emergency Room should their condition change prior to their follow-up appointment. The patient/family verbally agrees with the care-plan and verbally conveys that all of their questions have been answered. The discharge instructions have also been provided to the patient and/or family with some educational information regarding the patient's diagnosis as well a list of reasons why the patient would want to return to the ER prior to their follow-up appointment, should their condition change.  Written by Chaya Jan, PA-C    LABS COMPLETED AND REVIEWED:  Recent Results (from the past 12 hour(s))   METABOLIC PANEL, COMPREHENSIVE    Collection Time     04/13/14 12:52 PM       Result Value Ref Range    Sodium 135 (*) 136 - 145 mmol/L    Potassium 3.3 (*) 3.5 - 5.1 mmol/L    Chloride 101  97 - 108 mmol/L    CO2 27  21 - 32 mmol/L    Anion gap 7  5 - 15 mmol/L    Glucose 76  65 - 100 mg/dL    BUN 5 (*) 6 - 20 MG/DL    Creatinine 0.75  0.45 - 1.15 MG/DL    BUN/Creatinine ratio 7 (*) 12 - 20      GFR est AA >60  >60 ml/min/1.74m    GFR est non-AA >60  >60 ml/min/1.787m   Calcium 8.9  8.5 - 10.1 MG/DL    Bilirubin, total 0.6  0.2 - 1.0 MG/DL    ALT 39  12 - 78 U/L    AST 19  15 - 37 U/L    Alk. phosphatase 56  45 - 117 U/L    Protein, total 7.8  6.4 - 8.2 g/dL    Albumin 3.7  3.5 - 5.0 g/dL    Globulin 4.1 (*) 2.0 - 4.0 g/dL    A-G Ratio 0.9 (*) 1.1 - 2.2     CBC WITH AUTOMATED DIFF    Collection Time     04/13/14 12:52 PM       Result Value Ref Range    WBC 8.2  3.6 - 11.0 K/uL    RBC 4.31  3.80 - 5.20 M/uL    HGB 12.8  11.5 - 16.0 g/dL    HCT 36.8  35.0 - 47.0 %    MCV 85.4  80.0 - 99.0 FL    MCH 29.7  26.0 - 34.0 PG    MCHC 34.8   30.0 - 36.5 g/dL    RDW 12.9  11.5 - 14.5 %    PLATELET 251  150 - 400 K/uL    NEUTROPHILS 74  32 - 75 %    LYMPHOCYTES 21  12 - 49 %    MONOCYTES 5  5 - 13 %    EOSINOPHILS 0  0 - 7 %    BASOPHILS 0  0 - 1 %    ABS. NEUTROPHILS 6.0  1.8 - 8.0 K/UL    ABS. LYMPHOCYTES 1.7  0.8 - 3.5 K/UL    ABS. MONOCYTES 0.4  0.0 - 1.0 K/UL    ABS. EOSINOPHILS 0.0  0.0 - 0.4 K/UL    ABS. BASOPHILS 0.0  0.0 - 0.1 K/UL   LIPASE    Collection Time     04/13/14 12:52 PM       Result Value Ref Range    Lipase 92  73 - 393 U/L   GLUCOSE, POC    Collection Time  04/13/14  1:39 PM       Result Value Ref Range    Glucose (POC) 91  65 - 100 mg/dL    Performed by Lenore Manner "Lissa"     URINALYSIS W/ REFLEX CULTURE    Collection Time     04/13/14  1:41 PM       Result Value Ref Range    Color DARK YELLOW      Appearance CLOUDY (*) CLEAR      Specific gravity 1.015  1.003 - 1.030      pH (UA) 7.5  5.0 - 8.0      Protein 30 (*) NEG mg/dL    Glucose NEGATIVE   NEG mg/dL    Ketone 80 (*) NEG mg/dL    Blood NEGATIVE   NEG      Urobilinogen 1.0  0.2 - 1.0 EU/dL    Nitrites NEGATIVE   NEG      Leukocyte Esterase MODERATE (*) NEG      WBC 10-20  0 - 4 /hpf    RBC 5-10  0 - 5 /hpf    Epithelial cells MANY (*) FEW /lpf    Bacteria 2+ (*) NEG /hpf    UA:UC IF INDICATED URINE CULTURE ORDERED (*) CNI     BILIRUBIN, CONFIRM    Collection Time     04/13/14  1:41 PM       Result Value Ref Range    Bilirubin UA, confirm NEGATIVE   NEG           CLINICAL IMPRESSION:  1. Acute cystitis without hematuria    2. Nausea with vomiting    3. Hypokalemia        Plan:  1. Rocephin, Kdur, 2L NS in ED; passed PO challenge; Rx Keflex, Zofran ODT, Phenergan suppositories  2. OB f/u this week for re-check  3. Push liquids  4. Return to ER if worse    I was personally available for consultation in the emergency department.  I have reviewed the chart and agree with the documentation recorded by the [facility], including the assessment, treatment plan, and disposition.  Antonieta Pert, DO

## 2014-04-13 NOTE — ED Notes (Signed)
Pt has drank one entire 4 ounce of apple juice without nausea vomiting or dry heaves. Pt has a headache.

## 2014-04-13 NOTE — ED Notes (Signed)
bp lying and sitting and standing done

## 2014-04-13 NOTE — ED Notes (Signed)
Pt called on call bell , she incidentally noted that she thought she felt a lump in her right breast. She did want to have that evaluated.   Ryan called and notified Koleen Nimrod who came and examined pt. As I was taking IV out for discharge.  Pt was able to keep po apple juice and po ice water and some of the dinner down. Pt did not have either dry heaves nor vomiting while in ED.

## 2014-04-13 NOTE — ED Notes (Signed)
Still struggling with nausea. But still working on applesauce and taking po potassium pills. Iv antibiotic completed. Second liter is infusing.

## 2014-04-15 LAB — CULTURE, URINE
Colonies Counted: >100000
Colony Count: >100000

## 2014-05-16 LAB — CBC WITH AUTOMATED DIFF
ABS. BASOPHILS: 0 10*3/uL (ref 0.0–0.1)
ABS. EOSINOPHILS: 0.1 10*3/uL (ref 0.0–0.4)
ABS. LYMPHOCYTES: 2 10*3/uL (ref 0.8–3.5)
ABS. MONOCYTES: 0.4 10*3/uL (ref 0.0–1.0)
ABS. NEUTROPHILS: 6.2 10*3/uL (ref 1.8–8.0)
BASOPHILS: 0 % (ref 0–1)
EOSINOPHILS: 1 % (ref 0–7)
HCT: 37.1 % (ref 35.0–47.0)
HGB: 12.9 g/dL (ref 11.5–16.0)
LYMPHOCYTES: 23 % (ref 12–49)
MCH: 29.9 PG (ref 26.0–34.0)
MCHC: 34.8 g/dL (ref 30.0–36.5)
MCV: 86.1 FL (ref 80.0–99.0)
MONOCYTES: 5 % (ref 5–13)
NEUTROPHILS: 71 % (ref 32–75)
PLATELET: 295 10*3/uL (ref 150–400)
RBC: 4.31 M/uL (ref 3.80–5.20)
RDW: 13.1 % (ref 11.5–14.5)
WBC: 8.7 10*3/uL (ref 3.6–11.0)

## 2014-05-16 LAB — METABOLIC PANEL, COMPREHENSIVE
A-G Ratio: 0.8 — ABNORMAL LOW (ref 1.1–2.2)
ALT (SGPT): 14 U/L (ref 12–78)
AST (SGOT): 10 U/L — ABNORMAL LOW (ref 15–37)
Albumin: 3.4 g/dL — ABNORMAL LOW (ref 3.5–5.0)
Alk. phosphatase: 57 U/L (ref 45–117)
Anion gap: 7 mmol/L (ref 5–15)
BUN/Creatinine ratio: 10 — ABNORMAL LOW (ref 12–20)
BUN: 7 MG/DL (ref 6–20)
Bilirubin, total: 0.4 MG/DL (ref 0.2–1.0)
CO2: 25 mmol/L (ref 21–32)
Calcium: 9.3 MG/DL (ref 8.5–10.1)
Chloride: 103 mmol/L (ref 97–108)
Creatinine: 0.69 MG/DL (ref 0.45–1.15)
GFR est AA: >60 mL/min/{1.73_m2} (ref >60)
GFR est non-AA: >60 mL/min/{1.73_m2} (ref >60)
Globulin: 4.1 g/dL — ABNORMAL HIGH (ref 2.0–4.0)
Glucose: 106 mg/dL — ABNORMAL HIGH (ref 65–100)
Potassium: 3.8 mmol/L (ref 3.5–5.1)
Protein, total: 7.5 g/dL (ref 6.4–8.2)
Sodium: 135 mmol/L — ABNORMAL LOW (ref 136–145)

## 2014-05-16 LAB — URINALYSIS W/ REFLEX CULTURE
Blood: NEGATIVE
Glucose: NEGATIVE mg/dL
Ketone: NEGATIVE mg/dL
Nitrites: NEGATIVE
Specific gravity: 1.025 (ref 1.003–1.030)
Urobilinogen: 1 EU/dL (ref 0.2–1.0)
pH (UA): 7 (ref 5.0–8.0)

## 2014-05-16 LAB — WET PREP: Wet prep: NONE SEEN

## 2014-05-16 LAB — BILIRUBIN, CONFIRM: Bilirubin UA, confirm: NEGATIVE

## 2014-05-16 LAB — BETA HCG, QT
Beta HCG, QT: 47788 m[IU]/mL — ABNORMAL HIGH (ref 0–6)
hCG Quant: 47788 m[IU]/mL — ABNORMAL HIGH (ref 0–6)

## 2014-05-16 LAB — KOH, OTHER SOURCES: KOH: NONE SEEN

## 2014-05-16 LAB — HCG URINE, QL: HCG urine, QL: POSITIVE — AB

## 2014-05-16 MED ORDER — ACETAMINOPHEN 325 MG TABLET
325 mg | ORAL | Status: AC
Start: 2014-05-16 — End: 2014-05-16
  Administered 2014-05-16: 21:00:00 via ORAL

## 2014-05-16 MED ORDER — CEPHALEXIN 500 MG CAP
500 mg | ORAL_CAPSULE | Freq: Three times a day (TID) | ORAL | Status: AC
Start: 2014-05-16 — End: 2014-05-21

## 2014-05-16 MED FILL — MAPAP (ACETAMINOPHEN) 325 MG TABLET: 325 mg | ORAL | Qty: 2

## 2014-05-16 NOTE — ED Provider Notes (Signed)
HPI Comments: Amanda Little is a G59P1A3 23 y.o. female at approximately 12 wga who presents ambulatory to [facility] ED with cc of intermittent 7/10 lower abd cramping x 3 days. Pt states she was last evaluated by GYN last month and denies calling them regarding current sxs. Pt states she has been staying hydrated. Pt notes her LMP in April and her LBM was 4 days ago which is normal. Pt notes she is passing flatus. Pt denies taking any regular medications other than prenatal vitamins. Pt denies N/V/D, vaginal discharge, vaginal bleeding, dysuria or hematuria. Pt is taking PNV.    Allergies: NKDA    PCP: None (General)    PMhx is significant for: Abortion, miscarriage  PShx is significant for: Abortion   Social hx: - Tobacco, - EtOH, - Illicit Drugs    There are no other complaints, changes or physical findings at this time.  Written by Sylvan Cheese, ED Scribe, as dictated by Ocie Cornfield, MD.        The history is provided by the patient. No language interpreter was used.        No past medical history on file.     No past surgical history on file.      Family History   Problem Relation Age of Onset   ??? Hypertension Mother    ??? Hypertension Maternal Grandfather         History     Social History   ??? Marital Status: SINGLE     Spouse Name: N/A     Number of Children: N/A   ??? Years of Education: N/A     Occupational History   ??? Not on file.     Social History Main Topics   ??? Smoking status: Never Smoker    ??? Smokeless tobacco: Never Used   ??? Alcohol Use: No   ??? Drug Use: No   ??? Sexual Activity: No     Other Topics Concern   ??? Not on file     Social History Narrative                  ALLERGIES: Review of patient's allergies indicates no known allergies.      Review of Systems   Constitutional: Negative for fever, chills and fatigue.   HENT: Negative for congestion, rhinorrhea and sore throat.    Eyes: Negative for pain, discharge and visual disturbance.    Respiratory: Negative for cough, chest tightness, shortness of breath and wheezing.    Cardiovascular: Negative for chest pain, palpitations and leg swelling.   Gastrointestinal: Positive for abdominal pain. Negative for nausea, vomiting, diarrhea and constipation.   Genitourinary: Negative for dysuria, frequency, hematuria, vaginal bleeding and vaginal discharge.   Musculoskeletal: Negative for myalgias, back pain and arthralgias.   Skin: Negative for rash.   Neurological: Negative for dizziness, weakness, light-headedness and headaches.   Psychiatric/Behavioral: Negative.        Filed Vitals:    05/16/14 1459   BP: 130/85   Pulse: 115   Temp: 98 ??F (36.7 ??C)   Resp: 18   Height: '5\' 1"'  (1.549 m)   Weight: 57.2 kg (126 lb 1.7 oz)   SpO2: 98%            Physical Exam   Constitutional: She is oriented to person, place, and time. She appears well-developed and well-nourished. No distress.   HENT:   Little: Normocephalic and atraumatic.   Eyes: EOM are normal. Right eye exhibits  no discharge. Left eye exhibits no discharge. No scleral icterus.   Neck: Normal range of motion. Neck supple. No tracheal deviation present.   Cardiovascular: Normal rate, regular rhythm, normal heart sounds and intact distal pulses.  Exam reveals no gallop and no friction rub.    No murmur heard.  Pulmonary/Chest: Effort normal and breath sounds normal. No respiratory distress. She has no wheezes. She has no rales.   Abdominal: Soft. She exhibits no distension. There is tenderness (mild) in the suprapubic area.   Genitourinary:   White vaginal discharge. No adnexal masses or adnexal tenderness. No CMT. No blood in vaginal canal   Musculoskeletal: Normal range of motion. She exhibits no edema.   Lymphadenopathy:     She has no cervical adenopathy.   Neurological: She is alert and oriented to person, place, and time.   No focal neuro deficits   Skin: Skin is warm and dry. No rash noted.   Psychiatric: She has a normal mood and affect.    Nursing note and vitals reviewed.  Written by Sylvan Cheese, ED Scribe, as dictated by Ocie Cornfield, MD.    MDM  Number of Diagnoses or Management Options  BV (bacterial vaginosis):   Pelvic pain in pregnancy:   Diagnosis management comments:     Differential includes UTI, STI, PID, ovarian cyst.  Doubt threatened/inevitable abortion.  Abdominal exam is benign - do not suspect acute intraabdominal process.  - CBC, CMP  - UA  - Pelvic exam and labs  - Bedside US         Amount and/or Complexity of Data Reviewed  Clinical lab tests: ordered and reviewed  Review and summarize past medical records: yes  Independent visualization of images, tracings, or specimens: yes    Patient Progress  Patient progress: stable      Procedures    Procedure Note - Pelvic Exam:    6:05 PM  Performed by: Ocie Cornfield, MD  Pelvic exam was performed using bimanual and speculum. Further findings noted in physical exam.   The procedure took 1-15 minutes, and pt tolerated well.  Written by Ilean Skill, ED Scribe, as dictated by Ocie Cornfield, MD.    Procedure Note - Limited Bedside Ultrasound- Pregnancy, FHT  6:05 PM  Performed by: Rip Harbour, MD  Indication: Pregnancy  Interpretation:  Pt with Positive IUP noted on Transabdominal bedside US showing Positive FHT at 160, calculated by counting.  Fetal movement was seen.  The procedure took 1-15 minutes, and pt tolerated well.   Written by Rip Harbour, MD    PROGRESS NOTE:  7:20 PM  UA consistent with UTI - will treat with course of keflex.  Work up otherwise unremarkable.  Advised close follow up with ob/gyn.  Discussed results, prescriptions and follow up plan with patient.  Provided customary return to ED instructions.  Patient expressed understanding.  Rip Harbour, MD    LABORATORY TESTS:  Recent Results (from the past 12 hour(s))   METABOLIC PANEL, COMPREHENSIVE    Collection Time: 05/16/14  3:18 PM   Result Value Ref Range     Sodium 135 (L) 136 - 145 mmol/L    Potassium 3.8 3.5 - 5.1 mmol/L    Chloride 103 97 - 108 mmol/L    CO2 25 21 - 32 mmol/L    Anion gap 7 5 - 15 mmol/L    Glucose 106 (H) 65 - 100 mg/dL    BUN 7 6 - 20 MG/DL  Creatinine 0.69 0.45 - 1.15 MG/DL    BUN/Creatinine ratio 10 (L) 12 - 20      GFR est AA >60 >60 ml/min/1.63m    GFR est non-AA >60 >60 ml/min/1.71m   Calcium 9.3 8.5 - 10.1 MG/DL    Bilirubin, total 0.4 0.2 - 1.0 MG/DL    ALT 14 12 - 78 U/L    AST 10 (L) 15 - 37 U/L    Alk. phosphatase 57 45 - 117 U/L    Protein, total 7.5 6.4 - 8.2 g/dL    Albumin 3.4 (L) 3.5 - 5.0 g/dL    Globulin 4.1 (H) 2.0 - 4.0 g/dL    A-G Ratio 0.8 (L) 1.1 - 2.2     TOTAL HCG, QT.    Collection Time: 05/16/14  3:18 PM   Result Value Ref Range    HCG, [place]. [address] (H) 0 - 6 MIU/ML   CBC WITH AUTOMATED DIFF    Collection Time: 05/16/14  3:21 PM   Result Value Ref Range    WBC 8.7 3.6 - 11.0 K/uL    RBC 4.31 3.80 - 5.20 M/uL    HGB 12.9 11.5 - 16.0 g/dL    HCT 37.1 35.0 - 47.0 %    MCV 86.1 80.0 - 99.0 FL    MCH 29.9 26.0 - 34.0 PG    MCHC 34.8 30.0 - 36.5 g/dL    RDW 13.1 11.5 - 14.5 %    PLATELET 295 150 - 400 K/uL    NEUTROPHILS 71 32 - 75 %    LYMPHOCYTES 23 12 - 49 %    MONOCYTES 5 5 - 13 %    EOSINOPHILS 1 0 - 7 %    BASOPHILS 0 0 - 1 %    ABS. NEUTROPHILS 6.2 1.8 - 8.0 K/UL    ABS. LYMPHOCYTES 2.0 0.8 - 3.5 K/UL    ABS. MONOCYTES 0.4 0.0 - 1.0 K/UL    ABS. EOSINOPHILS 0.1 0.0 - 0.4 K/UL    ABS. BASOPHILS 0.0 0.0 - 0.1 K/UL   URINALYSIS W/ REFLEX CULTURE    Collection Time: 05/16/14  4:28 PM   Result Value Ref Range    Color DARK YELLOW      Appearance TURBID (A) CLEAR      Specific gravity 1.025 1.003 - 1.030      pH (UA) 7.0 5.0 - 8.0      Protein TRACE (A) NEG mg/dL    Glucose NEGATIVE  NEG mg/dL    Ketone NEGATIVE  NEG mg/dL    Blood NEGATIVE  NEG      Urobilinogen 1.0 0.2 - 1.0 EU/dL    Nitrites NEGATIVE  NEG      Leukocyte Esterase MODERATE (A) NEG      WBC 5-10 0 - 4 /hpf    RBC 0-5 0 - 5 /hpf     Epithelial cells MANY (A) FEW /lpf    Bacteria 1+ (A) NEG /hpf    UA:UC IF INDICATED URINE CULTURE ORDERED (A) CNI      Amorphous Crystals 3+ (A) NEG   HCG URINE, QL    Collection Time: 05/16/14  4:28 PM   Result Value Ref Range    HCG urine, Ql. POSITIVE (A) NEG     BILIRUBIN, CONFIRM    Collection Time: 05/16/14  4:28 PM   Result Value Ref Range    Bilirubin UA, confirm NEGATIVE  NEG     KOH, OTHER SOURCES  Collection Time: 05/16/14  6:01 PM   Result Value Ref Range    Special Requests: NO SPECIAL REQUESTS      KOH NO YEAST SEEN     WET PREP    Collection Time: 05/16/14  6:01 PM   Result Value Ref Range    Clue cells CLUE CELLS PRESENT      Wet prep NO TRICHOMONAS SEEN           MEDICATIONS GIVEN:  Medications   acetaminophen (TYLENOL) tablet 650 mg (650 mg Oral Given 05/16/14 1717)       IMPRESSION:  1. Pelvic pain in pregnancy    2. BV (bacterial vaginosis)        PLAN:  1. Discharge with RX Keflex  2.F/U with Dr. Doreene Nest  Return to ED if worse       Discharge Note  7:24 PM    The patient has been re-evaluated and is ready for discharge. Reviewed available results with pt. and available family. Counseled pt on diagnosis and plan of care. Pt expressed understanding and all questions have been answered. Pt agrees with plan and agrees to f/u as recommended or return to the ED if sxs worsen. Discharge instructions have been provided and explained to the pt, along with reasons to return to the ED.  Written by Ilean Skill, ED scribe, as dictated by Ocie Cornfield, MD.

## 2014-05-16 NOTE — ED Notes (Signed)
Pt with abd cramping x3 days, denies vaginal complaints. Pt denies contact with OB.

## 2014-05-16 NOTE — ED Notes (Signed)
Pt resting at this time, pt with family at side, NAD.

## 2014-05-16 NOTE — ED Notes (Signed)
Pt ambulatory with a steady gait to room 38 for treatment.

## 2014-05-16 NOTE — ED Notes (Signed)
Dr. Ashwell at bedside.

## 2014-05-16 NOTE — ED Notes (Signed)
MD at bedside with us, aware of plans for results and dispo.

## 2014-05-16 NOTE — ED Notes (Signed)
Pt discharged by Dr. Bryan LemmaAshwell. Pt is ambulatory and will transport self home.

## 2014-05-18 LAB — CULTURE, URINE
Colonies Counted: 40000
Colony Count: 40000

## 2014-05-19 LAB — CHLAMYDIA/GC PCR
Chlamydia amplified: NEGATIVE
N. gonorrhea, amplified: NEGATIVE

## 2014-06-05 LAB — URINALYSIS W/ REFLEX CULTURE
Bilirubin: NEGATIVE
Blood: NEGATIVE
Glucose: NEGATIVE mg/dL
Ketone: NEGATIVE mg/dL
Nitrites: NEGATIVE
Specific gravity: 1.017 (ref 1.003–1.030)
Urobilinogen: 0.2 EU/dL (ref 0.2–1.0)
pH (UA): 8.5 — ABNORMAL HIGH (ref 5.0–8.0)

## 2014-06-05 LAB — CBC WITH AUTOMATED DIFF
ABS. BASOPHILS: 0 10*3/uL (ref 0.0–0.1)
ABS. EOSINOPHILS: 0.1 10*3/uL (ref 0.0–0.4)
ABS. LYMPHOCYTES: 1.6 10*3/uL (ref 0.8–3.5)
ABS. MONOCYTES: 0.4 10*3/uL (ref 0.0–1.0)
ABS. NEUTROPHILS: 5.6 10*3/uL (ref 1.8–8.0)
BASOPHILS: 0 % (ref 0–1)
EOSINOPHILS: 1 % (ref 0–7)
HCT: 34.8 % — ABNORMAL LOW (ref 35.0–47.0)
HGB: 12.1 g/dL (ref 11.5–16.0)
LYMPHOCYTES: 21 % (ref 12–49)
MCH: 30.3 PG (ref 26.0–34.0)
MCHC: 34.8 g/dL (ref 30.0–36.5)
MCV: 87 FL (ref 80.0–99.0)
MONOCYTES: 5 % (ref 5–13)
NEUTROPHILS: 73 % (ref 32–75)
PLATELET: 253 10*3/uL (ref 150–400)
RBC: 4 M/uL (ref 3.80–5.20)
RDW: 13.6 % (ref 11.5–14.5)
WBC: 7.7 10*3/uL (ref 3.6–11.0)

## 2014-06-05 LAB — METABOLIC PANEL, COMPREHENSIVE
A-G Ratio: 0.7 — ABNORMAL LOW (ref 1.1–2.2)
ALT (SGPT): 15 U/L (ref 12–78)
AST (SGOT): 13 U/L — ABNORMAL LOW (ref 15–37)
Albumin: 2.9 g/dL — ABNORMAL LOW (ref 3.5–5.0)
Alk. phosphatase: 64 U/L (ref 45–117)
Anion gap: 8 mmol/L (ref 5–15)
BUN/Creatinine ratio: 11 — ABNORMAL LOW (ref 12–20)
BUN: 5 MG/DL — ABNORMAL LOW (ref 6–20)
Bilirubin, total: 0.2 MG/DL (ref 0.2–1.0)
CO2: 25 mmol/L (ref 21–32)
Calcium: 8.9 MG/DL (ref 8.5–10.1)
Chloride: 104 mmol/L (ref 97–108)
Creatinine: 0.46 MG/DL (ref 0.45–1.15)
GFR est AA: >60 mL/min/{1.73_m2} (ref >60)
GFR est non-AA: >60 mL/min/{1.73_m2} (ref >60)
Globulin: 4 g/dL (ref 2.0–4.0)
Glucose: 81 mg/dL (ref 65–100)
Potassium: 3.7 mmol/L (ref 3.5–5.1)
Protein, total: 6.9 g/dL (ref 6.4–8.2)
Sodium: 137 mmol/L (ref 136–145)

## 2014-06-05 LAB — KOH, OTHER SOURCES

## 2014-06-05 LAB — HCG URINE, QL. - POC: Pregnancy test,urine (POC): POSITIVE — AB

## 2014-06-05 LAB — BETA HCG, QT
Beta HCG, QT: 26281 m[IU]/mL — ABNORMAL HIGH (ref 0–6)
hCG Quant: 26281 m[IU]/mL — ABNORMAL HIGH (ref 0–6)

## 2014-06-05 LAB — WET PREP
Clue cells: ABSENT
Wet prep: NONE SEEN

## 2014-06-05 MED ORDER — CLOTRIMAZOLE 1 % VAGINAL CREAM
1 % | Freq: Every evening | VAGINAL | Status: AC
Start: 2014-06-05 — End: 2014-06-12

## 2014-06-05 MED ORDER — NITROFURANTOIN (25% MACROCRYSTAL FORM) 100 MG CAP
100 mg | ORAL_CAPSULE | Freq: Two times a day (BID) | ORAL | Status: AC
Start: 2014-06-05 — End: 2014-06-12

## 2014-06-05 NOTE — ED Provider Notes (Signed)
HPI Comments: 23 y/o F G6P2 currently 12wks preg with pelvic discomfort [facility] [place] - [place] - no urinary sx/vaginal discharge or hx sti/pid.  No abd pain/vomtiing/diarrhea.  No fever/chills.  Sx onset this morning.  No vaginal bleeding/spotting.  No hx ectopic or abd surgery.  Has undergone US confirming IUP.  Patient did not contact her OB prior to coming in today.  Otherwise well, without complaint.        History reviewed. No pertinent past medical history.     History reviewed. No pertinent past surgical history.      Family History   Problem Relation Age of Onset   ??? Hypertension Mother    ??? Hypertension Maternal Grandfather         History     Social History   ??? Marital Status: SINGLE     Spouse Name: N/A     Number of Children: N/A   ??? Years of Education: N/A     Occupational History   ??? Not on file.     Social History Main Topics   ??? Smoking status: Never Smoker    ??? Smokeless tobacco: Never Used   ??? Alcohol Use: No   ??? Drug Use: No   ??? Sexual Activity: No     Other Topics Concern   ??? Not on file     Social History Narrative                  ALLERGIES: Review of patient's allergies indicates no known allergies.      Review of Systems   Genitourinary: Positive for pelvic pain. Negative for dysuria, frequency, flank pain, vaginal bleeding, vaginal discharge and genital sores.   All other systems reviewed and are negative.      Filed Vitals:    06/05/14 1157   BP: 114/67   Pulse: 94   Temp: 98 ??F (36.7 ??C)   Resp: 18   Height: 5\' 2"  (1.575 m)   Weight: 58.968 kg (130 lb)   SpO2: 97%            Physical Exam   Constitutional: She appears well-developed and well-nourished.   HENT:   Head: Normocephalic and atraumatic.   Mouth/Throat: Oropharynx is clear and moist.   Neck: Normal range of motion. Neck supple.   Cardiovascular: Normal rate and regular rhythm.    Pulmonary/Chest: Effort normal and breath sounds normal.    Abdominal: Soft. There is tenderness in the suprapubic area. Hernia confirmed negative in the right inguinal area and confirmed negative in the left inguinal area.   Genitourinary: Cervix exhibits no motion tenderness, no discharge and no friability. Right adnexum displays no tenderness. Left adnexum displays no tenderness. Vaginal discharge found.   Scant thin discharge    Lymphadenopathy:        Right: No inguinal adenopathy present.        Left: No inguinal adenopathy present.   Neurological: She is alert.   Skin: Skin is warm and dry. No erythema.   Psychiatric: She has a normal mood and affect.   Nursing note and vitals reviewed.       MDM  Number of Diagnoses or Management Options  Bacteriuria:   Vaginal candidiasis:   Diagnosis management comments: Suprapubic discomfort since this am, patient well appearing, moves w/o e/o discomfort, labs/US reviewed, discussed with Vinnie LangtonGretchen NP - agrees with plan, she will f/u next week.  Patient more comfortable at time of discharge, tolerating po without  issue.      No adnexal discomfort, do not feel that repeat imaging to better delineate L ovary indicated.       Procedures

## 2014-06-05 NOTE — ED Notes (Signed)
PT GIVEN COPY OF DISCHARGE INSTRUCTIONS, PT VERBALIZED UNDERSTANDING, QUESTIONS ANSWERED, DISCHARGED IN STABLE CONDITION

## 2014-06-05 NOTE — ED Notes (Signed)
Triage: Pt in after waking up this morning with "bad stomach pains, worse after I try to pee." Denies vaginal bleeding. Denies odor/burning. Pt is [redacted] weeks pregnant. Denies v/d. +nausea denies fevers.

## 2014-06-06 LAB — CHLAMYDIA/GC PCR
Chlamydia amplified: NEGATIVE
N. gonorrhea, amplified: NEGATIVE

## 2014-06-07 LAB — CULTURE, URINE
Colonies Counted: >100000
Colony Count: >100000

## 2014-10-05 ENCOUNTER — Inpatient Hospital Stay: Payer: MEDICAID

## 2014-10-05 LAB — URINALYSIS W/ REFLEX CULTURE
Bilirubin: NEGATIVE
Blood: NEGATIVE
Glucose: NEGATIVE mg/dL
Ketone: NEGATIVE mg/dL
Nitrites: NEGATIVE
Protein: NEGATIVE mg/dL
Specific gravity: 1.018 (ref 1.003–1.030)
Urobilinogen: 0.2 EU/dL (ref 0.2–1.0)
pH (UA): 7.5 (ref 5.0–8.0)

## 2014-10-05 LAB — FETAL FIBRONECTIN: Fetal fibronectin: NEGATIVE

## 2014-10-05 NOTE — Progress Notes (Signed)
Dr. Zachery DauerBarnes in.  FFN done and sve.

## 2014-10-05 NOTE — H&P (Signed)
History & Physical    Name: Amanda Little MRN: [id number]  SSN: [id number]    Date of Birth: 1991-05-20  Age: 23 y.o.  Sex: female      Subjective:     Reason for Admission:  Pregnancy and pelvic pressure.    History of Present Illness: Amanda Little is a 23 y.o. African American female with an estimated gestational age of 8619w3d with Estimated Date of Delivery: 11/27/14. Patient complains of onset several days ago of pelvic pressure, worse since 0400 hrs today.  Denies bleeding or ruptured membranes, but has noticed more mucoid vaginal discharge recently.  Baby is active.  History of previous cesarean section for hypertensive disorder of pregnancy.     OB History   Gravida Para Term Preterm AB SAB TAB Ectopic Multiple Living   8 1 1  6  6   1       # Outcome Date GA Lbr Len/2nd Weight Sex Delivery Anes PTL Lv   8 Current            7 TAB 06/22/08    U       6 TAB            5 TAB            4 TAB            3 TAB            2 TAB            1 Term  2770w0d  3.2 kg F C-SECTION LO EPIDURAL AN N Y        History reviewed. No pertinent past medical history.  Past Surgical History   Procedure Laterality Date   ??? Pr cesarean delivery only       c/s Aug 28, 2009     Social History     Occupational History   ??? Not on file.     Social History Main Topics   ??? Smoking status: Current Every Day Smoker -- 0.25 packs/day   ??? Smokeless tobacco: Never Used   ??? Alcohol Use: No   ??? Drug Use: No   ??? Sexual Activity: No     Family History   Problem Relation Age of Onset   ??? Hypertension Mother    ??? Lupus Mother    ??? Hypertension Maternal Grandfather        Allergies   Allergen Reactions   ??? Latex, Natural Rubber Itching     Prior to Admission medications    Medication Sig Start Date End Date Taking? Authorizing Provider   PNV#16-Iron Fum & PS-FA-OM-3 35-1-200 mg cap Take  by mouth.   Yes Phys Other, MD        Review of Systems   Constitutional: Negative.    HENT: Negative.    Eyes: Negative.    Respiratory: Negative.     Cardiovascular: Negative.    Gastrointestinal: Negative.    Endocrine: Negative.    Genitourinary: Negative.    Musculoskeletal: Negative.    Skin: Negative.    Allergic/Immunologic: Negative.    Neurological: Negative.    Hematological: Negative.    Psychiatric/Behavioral: Negative.        Objective:     Vitals:    Filed Vitals:    10/05/14 1314 10/05/14 1317   BP:  113/82   Pulse:  78   Temp:  97.6 ??F (36.4 ??C)   Resp:  18   Height: 5\' 2"  (1.575 m)  Weight: 64.864 kg (143 lb)       Temp (24hrs), Avg:97.6 ??F (36.4 ??C), Min:97.6 ??F (36.4 ??C), Max:97.6 ??F (36.4 ??C)    BP  Min: 113/82  Max: 113/82     Physical Exam   Nursing note and vitals reviewed.  Constitutional: She is oriented to person, place, and time. She appears well-developed and well-nourished.   HENT:   Head: Normocephalic and atraumatic.   Eyes: Conjunctivae are normal. Pupils are equal, round, and reactive to light.   Neck: Normal range of motion.   Cardiovascular: Normal rate and regular rhythm.    Pulmonary/Chest: Effort normal.   Abdominal:   Gravid uterus.  FH 30 cm, occasional mild contraction.  FHR 150, category 1.   Genitourinary:   External genitalia:  Normal. Vagina:  No bleeding, no show or evidence of ruptured membranes.  Cervix is very posterior, normal consistency, long, closed, with a very high, unidentifiable presenting fetal part.   Musculoskeletal: Normal range of motion.   Neurological: She is alert and oriented to person, place, and time.   Skin: Skin is warm and dry.   Psychiatric: She has a normal mood and affect. Her behavior is normal.       Cervical Exam: Closed/Thick/High  Uterine Activity: rare, mild.  Membranes: Intact  Fetal Heart Rate: 159 bpm, category 1.       Labs:   Recent Results (from the past 24 hour(s))   URINALYSIS W/ REFLEX CULTURE    Collection Time: 10/05/14  2:01 PM   Result Value Ref Range    Color YELLOW/STRAW      Appearance CLOUDY (A) CLEAR      Specific gravity 1.018 1.003 - 1.030       pH (UA) 7.5 5.0 - 8.0      Protein NEGATIVE  NEG mg/dL    Glucose NEGATIVE  NEG mg/dL    Ketone NEGATIVE  NEG mg/dL    Bilirubin NEGATIVE  NEG      Blood NEGATIVE  NEG      Urobilinogen 0.2 0.2 - 1.0 EU/dL    Nitrites NEGATIVE  NEG      Leukocyte Esterase MODERATE (A) NEG      WBC PENDING /hpf    RBC PENDING /hpf    Epithelial cells PENDING /lpf    Bacteria PENDING /hpf    UA:UC IF INDICATED PENDING        Assessment and Plan:     Active Problems:    * No active hospital problems. *     Pelvic pressure, no evidence of premature cervical change.  PLAN:  Fetal fibronectin collected before pelvic exam.               Keep prenatal appt. On 10/08/14.      Signed By:  Precious ReelEverett D Rishi Vicario, MD     October 05, 2014

## 2014-10-05 NOTE — Progress Notes (Signed)
FFN negative

## 2014-10-05 NOTE — Progress Notes (Signed)
Dr. Zachery DauerBarnes notif of neg FFN and reviewed ua results.  Pt may be discharged with follow up with her previously scheduled appt this week.   PTL instr reviewed with pt and she verbilizes understanding. Discharged home ambulatory accompanied by family.

## 2014-10-05 NOTE — Progress Notes (Addendum)
Pt arrived via squad Gr 8 Para 1 c/s for failure to progress Ab 6. EDC 11/27/13. 32 3/7 weeks.  [place]ition[place]. Pt laughing comfortable looking. Placed on monitor, consents signed. jmj

## 2014-10-07 LAB — CULTURE, URINE
Colonies Counted: >100000
Colony Count: >100000

## 2014-10-22 NOTE — Progress Notes (Addendum)
G8 P1 admitted to triage with ruq pain that started yesterday and worsen today. Denies LOF or VB. Positive FM> Pt denies complications with pregnancy.see flowsheet for pain level- pt is noted on her cell phone texting  2031 Dr Bethena MidgetMead notified of this pt being here and her complaint  2042 POC discussed with pt- she agrees- SVE closed cervix  2129 in room 3184- cal light in reach. Pt is talking on her cell phone with a smile  2236 "i feel okay" assisted to BR. Pt agrees she is feeling better than when she came to hospital  0155 medicated with demerol as requested "due to just cant sleep" denies pain  0241 pt is sleeping. resp noted regular  0318 pt up to BR. Denies pain  06- lots of FM audible on Monitor.pt reports she did not make it to her last PNV. So has non scheduled as of yet.  [id number] Dr Curlene LabrumShaban at bedside- discharge orders. Pt will call and make appointment for tomorrow  0730 discharge instructions reviewed- pt is discharged

## 2014-10-23 ENCOUNTER — Inpatient Hospital Stay: Payer: MEDICAID

## 2014-10-23 LAB — URINALYSIS W/ REFLEX CULTURE
Bacteria: NEGATIVE /hpf
Bilirubin: NEGATIVE
Blood: NEGATIVE
Glucose: NEGATIVE mg/dL
Ketone: NEGATIVE mg/dL
Nitrites: NEGATIVE
Protein: NEGATIVE mg/dL
Specific gravity: <1.005 (ref 1.003–1.030)
Urobilinogen: 0.2 EU/dL (ref 0.2–1.0)
pH (UA): 7 (ref 5.0–8.0)

## 2014-10-23 MED ORDER — LACTATED RINGERS IV
INTRAVENOUS | Status: DC
Start: 2014-10-23 — End: 2014-10-23
  Administered 2014-10-23 (×2): via INTRAVENOUS

## 2014-10-23 MED ORDER — PROCHLORPERAZINE EDISYLATE 5 MG/ML INJECTION
5 mg/mL | Freq: Four times a day (QID) | INTRAMUSCULAR | Status: DC | PRN
Start: 2014-10-23 — End: 2014-10-23
  Administered 2014-10-23: 07:00:00 via INTRAVENOUS

## 2014-10-23 MED ORDER — MEPERIDINE (PF) 50 MG/ML INJ SOLN
50 mg/ml | INTRAMUSCULAR | Status: DC | PRN
Start: 2014-10-23 — End: 2014-10-23
  Administered 2014-10-23: 07:00:00 via INTRAVENOUS

## 2014-10-23 MED ORDER — SODIUM CHLORIDE 0.9 % IJ SYRG
INTRAMUSCULAR | Status: DC
Start: 2014-10-23 — End: 2014-10-23

## 2014-10-23 MED ORDER — PROCHLORPERAZINE EDISYLATE 5 MG/ML INJECTION
5 mg/mL | Freq: Four times a day (QID) | INTRAMUSCULAR | Status: DC | PRN
Start: 2014-10-23 — End: 2014-10-23

## 2014-10-23 MED ORDER — ZOLPIDEM 5 MG TAB
5 mg | Freq: Every evening | ORAL | Status: DC | PRN
Start: 2014-10-23 — End: 2014-10-23
  Administered 2014-10-23: 02:00:00 via ORAL

## 2014-10-23 MED FILL — LACTATED RINGERS IV: INTRAVENOUS | Qty: 1000

## 2014-10-23 MED FILL — DEMEROL (PF) 50 MG/ML INJECTION SYRINGE: 50 mg/mL | INTRAMUSCULAR | Qty: 1

## 2014-10-23 MED FILL — ZOLPIDEM 5 MG TAB: 5 mg | ORAL | Qty: 1

## 2014-10-23 MED FILL — PROCHLORPERAZINE EDISYLATE 5 MG/ML INJECTION: 5 mg/mL | INTRAMUSCULAR | Qty: 2

## 2014-10-23 MED FILL — BD POSIFLUSH NORMAL SALINE 0.9 % INJECTION SYRINGE: INTRAMUSCULAR | Qty: 10

## 2014-10-23 NOTE — Progress Notes (Signed)
Patient resting VS: Stable  NST: Reactive    P/E: No change    Lungs : CTA  CVS: WNL  ABD: WNL    Plan: D/C Home  Discussed at length s/s of labor  All Q"s answered and verbalized understanding  F/U 24hrs

## 2014-10-24 LAB — GYN RAPID GP B STREP, EXTERNAL: GrBStrep, External: NEGATIVE

## 2014-10-28 ENCOUNTER — Inpatient Hospital Stay: Payer: MEDICAID

## 2014-10-28 LAB — METABOLIC PANEL, COMPREHENSIVE
A-G Ratio: 0.6 — ABNORMAL LOW (ref 1.1–2.2)
ALT (SGPT): 16 U/L (ref 12–78)
AST (SGOT): 17 U/L (ref 15–37)
Albumin: 2.8 g/dL — ABNORMAL LOW (ref 3.5–5.0)
Alk. phosphatase: 184 U/L — ABNORMAL HIGH (ref 45–117)
Anion gap: 8 mmol/L (ref 5–15)
BUN/Creatinine ratio: 8 — ABNORMAL LOW (ref 12–20)
BUN: 4 MG/DL — ABNORMAL LOW (ref 6–20)
Bilirubin, total: 0.6 MG/DL (ref 0.2–1.0)
CO2: 22 mmol/L (ref 21–32)
Calcium: 8.3 MG/DL — ABNORMAL LOW (ref 8.5–10.1)
Chloride: 105 mmol/L (ref 97–108)
Creatinine: 0.52 MG/DL — ABNORMAL LOW (ref 0.55–1.02)
GFR est AA: >60 mL/min/{1.73_m2} (ref >60)
GFR est non-AA: >60 mL/min/{1.73_m2} (ref >60)
Globulin: 4.5 g/dL — ABNORMAL HIGH (ref 2.0–4.0)
Glucose: 77 mg/dL (ref 65–100)
Potassium: 3.5 mmol/L (ref 3.5–5.1)
Protein, total: 7.3 g/dL (ref 6.4–8.2)
Sodium: 135 mmol/L — ABNORMAL LOW (ref 136–145)

## 2014-10-28 LAB — CBC WITH AUTOMATED DIFF
ABS. BASOPHILS: 0 10*3/uL (ref 0.0–0.1)
ABS. EOSINOPHILS: 0 10*3/uL (ref 0.0–0.4)
ABS. LYMPHOCYTES: 1.7 10*3/uL (ref 0.8–3.5)
ABS. MONOCYTES: 0.4 10*3/uL (ref 0.0–1.0)
ABS. NEUTROPHILS: 7.5 10*3/uL (ref 1.8–8.0)
BASOPHILS: 0 % (ref 0–1)
EOSINOPHILS: 0 % (ref 0–7)
HCT: 35 % (ref 35.0–47.0)
HGB: 11.7 g/dL (ref 11.5–16.0)
LYMPHOCYTES: 18 % (ref 12–49)
MCH: 28 PG (ref 26.0–34.0)
MCHC: 33.4 g/dL (ref 30.0–36.5)
MCV: 83.7 FL (ref 80.0–99.0)
MONOCYTES: 4 % — ABNORMAL LOW (ref 5–13)
NEUTROPHILS: 78 % — ABNORMAL HIGH (ref 32–75)
PLATELET: 227 10*3/uL (ref 150–400)
RBC: 4.18 M/uL (ref 3.80–5.20)
RDW: 13.2 % (ref 11.5–14.5)
WBC: 9.7 10*3/uL (ref 3.6–11.0)

## 2014-10-28 MED ORDER — PROMETHAZINE 25 MG/ML INJECTION
25 mg/mL | INTRAMUSCULAR | Status: DC | PRN
Start: 2014-10-28 — End: 2014-10-28

## 2014-10-28 MED ORDER — LACTATED RINGERS IV
INTRAVENOUS | Status: DC
Start: 2014-10-28 — End: 2014-10-28
  Administered 2014-10-28: 22:00:00 via INTRAVENOUS

## 2014-10-28 MED ORDER — LACTATED RINGERS IV
Freq: Once | INTRAVENOUS | Status: AC
Start: 2014-10-28 — End: 2014-10-29

## 2014-10-28 MED ORDER — PROCHLORPERAZINE EDISYLATE 5 MG/ML INJECTION
5 mg/mL | Freq: Four times a day (QID) | INTRAMUSCULAR | Status: DC | PRN
Start: 2014-10-28 — End: 2014-10-29
  Administered 2014-10-28: via INTRAVENOUS

## 2014-10-28 MED ORDER — MEPERIDINE (PF) 25 MG/ML INJ SOLUTION
25 mg/ml | INTRAMUSCULAR | Status: DC | PRN
Start: 2014-10-28 — End: 2014-10-29
  Administered 2014-10-28: 23:00:00 via INTRAVENOUS

## 2014-10-28 MED ORDER — LACTATED RINGERS IV
INTRAVENOUS | Status: DC
Start: 2014-10-28 — End: 2014-10-29
  Administered 2014-10-28 – 2014-10-29 (×2): via INTRAVENOUS

## 2014-10-28 MED ORDER — MEPERIDINE (PF) 25 MG/ML INJ SOLUTION
25 mg/ml | INTRAMUSCULAR | Status: AC
Start: 2014-10-28 — End: 2014-10-29

## 2014-10-28 MED FILL — LACTATED RINGERS IV: INTRAVENOUS | Qty: 1000

## 2014-10-28 MED FILL — PROCHLORPERAZINE EDISYLATE 5 MG/ML INJECTION: 5 mg/mL | INTRAMUSCULAR | Qty: 2

## 2014-10-28 MED FILL — DEMEROL (PF) 25 MG/ML INJECTION SYRINGE: 25 mg/mL | INTRAMUSCULAR | Qty: 1

## 2014-10-28 NOTE — Progress Notes (Addendum)
Report from Dorrene GermanJ Lowe, RN.  Assumed care of pt at this time.  Per prior RN, plan is to get pt comfortable and or with decreased contractions and report back to Dr Curlene LabrumShaban.    1935- in to see pt - pt sleeping.  Toco adjusted.  FOB concerned about pt's well being - looking at the monitor strip.  I explained some of the reasons the pt could be contracting, but if the cervix does not change, she is not in labor.  Explained that pt is still early and we do not want to encourage labor at this point and will not augment or induce, especially as patient is a prior c-section and desires a TOLAC.      2038 - In to see pt - reports return of pain, no longer sleeping.  Pt rates pain now 7/10.  Plan to call Dr Curlene LabrumShaban in 1/2 hour when pt will have gotten 1 1/2 bags of fluid.  Educated pt on pain scales and gave examples.  Pt was talking to me through a contraction and when I told her that she was having a contraction, she said she didn't feel it.      2055 - Called Dr Curlene LabrumShaban, told him of less frequent but continued contractions, that pt does not always feel.  Will check cervix to make sure not in labor. Would like her to stay overnight with IV hydration, pain meds Q4 as needed and an [place] ordered for sleep.  If pt sleeping, do not need continuous monitoring.  Will move pt to 72 for overnight observation.      2105 - pt up to BR, walked to room 3172 for overnight observation.  Explained POC to patient.      2125 - Dr Curlene LabrumShaban notified of cervical exam - no change from earlier.  Pt on regular diet.      2205 - EFM and toco adjusted - very active fetus, difficult tracing.      2238 - EFM and toco removed.  Instructed pt will put back on if/when she gets pain medication.  Pt up to BR, 50 ml amber urine noted.     2338 - Pt up to BR, smiling and carrying on conversation.      0115 - pt's Significant other returned with food for pt.  Pt sitting up and and eating food.  No needs currently expressed.     0200 - pt resting comfortably in bed.      0310 - pt resting without distress    0535 - pt continues to rest.  No needs expressed.     [id number] - pt up to BR

## 2014-10-28 NOTE — Progress Notes (Signed)
[name] is a 23 year old G8P1 who arrived to room 3176  for contractions.   Patient seen by Dr. Morene RankinsSpasic prenatally.  Pt has a hx of previous c section, 6 TAB, and a current every day smoker.  Patient was seen by Dr. Morene RankinsSpasic today adn was sent over from the office for IV hydration. Patient states positive fetal movement and contractions. Patient denies LOF, vaginal bleeding, HA or blurred vision  EFM and TOCO applied and pt oriented to room. POC discussed.  Consents reviewed and signed.  Dr. Curlene LabrumShaban is aware of pt's arrival and status and orders pending.

## 2014-10-28 NOTE — Progress Notes (Signed)
1855-Patient off monitor to bathroom FHR 130 BPM

## 2014-10-28 NOTE — Progress Notes (Signed)
Report to A Stokes RN, care turned over at this time

## 2014-10-29 MED ORDER — ZOLPIDEM 5 MG TAB
5 mg | Freq: Every evening | ORAL | Status: DC | PRN
Start: 2014-10-29 — End: 2014-10-29

## 2014-10-29 NOTE — Progress Notes (Addendum)
0700-Report from A Stokes RN, care assumed at this time    0735-Dr. Shaban in to see patient, orders received to discharge patient so that she can go to her 8 am appointment with Dr. Osie Bondyce and 11 am appointment with MFM    0740-FHR 146 BPM, IV removed    0746-Discharge instructions reviewed and signed with patient    0748-Patient given copy of discharge instructions    0754-Patient ambulatory off unit with significant other with no signs of labor or distress

## 2014-10-29 NOTE — H&P (Signed)
History & Physical    Name: Amanda Little MRN: [id number]  SSN: [id number]    Date of Birth: 1991/03/01  Age: 23 y.o.  Sex: female      Subjective:     Reason for Admission:  Pregnancy and Preterm Labor    History of Present Illness: Amanda Little is a 23 y.o. African American female with an estimated gestational age of 70w6dwith Estimated Date of Delivery: 11/27/14. Patient complains of mild contractions for 1 days. Pregnancy has been complicated by prev c-sec. Patient denies chest pain, headache , nausea and vomiting, swelling and vaginal bleeding .    OB History   Gravida Para Term Preterm AB SAB TAB Ectopic Multiple Living   '8 1 1  6  6   1      ' # Outcome Date GA Lbr Len/2nd Weight Sex Delivery Anes PTL Lv   8 Current            7 TAB 06/22/08    U       6 TAB            5 TAB            4 TAB            3 TAB            2 TAB            1 Term  351w0d3.2 kg F C-SECTION LO EPIDURAL AN N Y        History reviewed. No pertinent past medical history.  Past Surgical History   Procedure Laterality Date   ??? Pr cesarean delivery only       c/s Aug 28, 2009     Social History     Occupational History   ??? Not on file.     Social History Main Topics   ??? Smoking status: Current Every Day Smoker -- 0.25 packs/day   ??? Smokeless tobacco: Never Used   ??? Alcohol Use: No   ??? Drug Use: No   ??? Sexual Activity: No      Family History   Problem Relation Age of Onset   ??? Hypertension Mother    ??? Lupus Mother    ??? Hypertension Maternal Grandfather        Allergies   Allergen Reactions   ??? Latex, Natural Rubber Itching     Prior to Admission medications    Medication Sig Start Date End Date Taking? Authorizing Provider   PNV#16-Iron Fum & [id number] 35-1-200 mg cap Take  by mouth.    Phys Other, MD        Review of Systems:  A comprehensive review of systems was negative except for that written in the History of Present Illness.     Objective:     Vitals:    Filed Vitals:    10/28/14 1840 10/28/14 1844 10/28/14 1845 10/28/14 2120    BP:  105/68  111/68   Pulse:    64   Temp:    97.8 ??F (36.6 ??C)   Resp:    16   Height:       Weight:       SpO2: 99%  99% 99%      Temp (24hrs), Avg:98 ??F (36.7 ??C), Min:97.8 ??F (36.6 ??C), Max:98.1 ??F (36.7 ??C)    BP  Min: 105/68  Max: 117/74     Physical Exam:  Patient without distress.  Heart: Regular rate  and rhythm or S1S2 present  Lung: clear to auscultation throughout lung fields, no wheezes, no rales, no rhonchi and normal respiratory effort  Abdomen: soft, nontender     Membranes:  Intact  Uterine Activity:  Frequency: Every 6 minutes   Fetal Heart Rate:  Reactive       Lab/Data Review:  Recent Results (from the past 24 hour(s))   CBC WITH AUTOMATED DIFF    Collection Time: 10/28/14  5:22 PM   Result Value Ref Range    WBC 9.7 3.6 - 11.0 K/uL    RBC 4.18 3.80 - 5.20 M/uL    HGB 11.7 11.5 - 16.0 g/dL    HCT 35.0 35.0 - 47.0 %    MCV 83.7 80.0 - 99.0 FL    MCH 28.0 26.0 - 34.0 PG    MCHC 33.4 30.0 - 36.5 g/dL    RDW 13.2 11.5 - 14.5 %    PLATELET 227 150 - 400 K/uL    NEUTROPHILS 78 (H) 32 - 75 %    LYMPHOCYTES 18 12 - 49 %    MONOCYTES 4 (L) 5 - 13 %    EOSINOPHILS 0 0 - 7 %    BASOPHILS 0 0 - 1 %    ABS. NEUTROPHILS 7.5 1.8 - 8.0 K/UL    ABS. LYMPHOCYTES 1.7 0.8 - 3.5 K/UL    ABS. MONOCYTES 0.4 0.0 - 1.0 K/UL    ABS. EOSINOPHILS 0.0 0.0 - 0.4 K/UL    ABS. BASOPHILS 0.0 0.0 - 0.1 K/UL   METABOLIC PANEL, COMPREHENSIVE    Collection Time: 10/28/14  5:22 PM   Result Value Ref Range    Sodium 135 (L) 136 - 145 mmol/L    Potassium 3.5 3.5 - 5.1 mmol/L    Chloride 105 97 - 108 mmol/L    CO2 22 21 - 32 mmol/L    Anion gap 8 5 - 15 mmol/L    Glucose 77 65 - 100 mg/dL    BUN 4 (L) 6 - 20 MG/DL    Creatinine 0.52 (L) 0.55 - 1.02 MG/DL    BUN/Creatinine ratio 8 (L) 12 - 20      GFR est AA >60 >60 ml/min/1.65m    GFR est non-AA >60 >60 ml/min/1.757m   Calcium 8.3 (L) 8.5 - 10.1 MG/DL    Bilirubin, total 0.6 0.2 - 1.0 MG/DL    ALT 16 12 - 78 U/L    AST 17 15 - 37 U/L    Alk. phosphatase 184 (H) 45 - 117 U/L     Protein, total 7.3 6.4 - 8.2 g/dL    Albumin 2.8 (L) 3.5 - 5.0 g/dL    Globulin 4.5 (H) 2.0 - 4.0 g/dL    A-G Ratio 0.6 (L) 1.1 - 2.2         Assessment and Plan:     c home f/u 1 week   - Preterm Labor:  Continuous Tocodynamometer      Signed By:  DABridgett LarssonMD     October 29, 2014

## 2014-11-10 ENCOUNTER — Inpatient Hospital Stay: Payer: MEDICAID

## 2014-11-10 LAB — CBC WITH AUTOMATED DIFF
ABS. BASOPHILS: 0 10*3/uL (ref 0.0–0.1)
ABS. EOSINOPHILS: 0 10*3/uL (ref 0.0–0.4)
ABS. LYMPHOCYTES: 1.8 10*3/uL (ref 0.8–3.5)
ABS. MONOCYTES: 0.4 10*3/uL (ref 0.0–1.0)
ABS. NEUTROPHILS: 6 10*3/uL (ref 1.8–8.0)
BASOPHILS: 0 % (ref 0–1)
EOSINOPHILS: 0 % (ref 0–7)
HCT: 33.3 % — ABNORMAL LOW (ref 35.0–47.0)
HGB: 10.9 g/dL — ABNORMAL LOW (ref 11.5–16.0)
LYMPHOCYTES: 22 % (ref 12–49)
MCH: 27.1 PG (ref 26.0–34.0)
MCHC: 32.7 g/dL (ref 30.0–36.5)
MCV: 82.8 FL (ref 80.0–99.0)
MONOCYTES: 4 % — ABNORMAL LOW (ref 5–13)
NEUTROPHILS: 74 % (ref 32–75)
PLATELET: 245 10*3/uL (ref 150–400)
RBC: 4.02 M/uL (ref 3.80–5.20)
RDW: 13.1 % (ref 11.5–14.5)
WBC: 8.2 10*3/uL (ref 3.6–11.0)

## 2014-11-10 LAB — URINALYSIS W/ REFLEX CULTURE
Bacteria: NEGATIVE /hpf
Bilirubin: NEGATIVE
Blood: NEGATIVE
Glucose: NEGATIVE mg/dL
Ketone: NEGATIVE mg/dL
Nitrites: NEGATIVE
Protein: NEGATIVE mg/dL
Specific gravity: 1.013 (ref 1.003–1.030)
Urobilinogen: 0.2 EU/dL (ref 0.2–1.0)
pH (UA): 7.5 (ref 5.0–8.0)

## 2014-11-10 MED ORDER — DEXTROSE 5%-LACTATED RINGERS IV
INTRAVENOUS | Status: DC
Start: 2014-11-10 — End: 2014-11-10
  Administered 2014-11-10: 18:00:00 via INTRAVENOUS

## 2014-11-10 MED FILL — DEXTROSE 5%-LACTATED RINGERS IV: INTRAVENOUS | Qty: 1000

## 2014-11-10 NOTE — Progress Notes (Addendum)
pt had called out about 15 minutes ago and asked about her POC; Dr Rosine Door at bedside; discussed POC with pt; did sve and no change noted so to IV hydrate and do labs; Dr Rosine Door discussed using demerol or nubain for pain management and sedation; pt's SO insisted that no narcotics be given; Dr Rosine Door explained that the right to decline interventions is the patient's; the patient then declined pain management with narcotics at this time; when SO left room during SVE, asked patient is she desires pain meds which she declined; assured pt that if she changed her mind, she could have something for pain; she states she understands.

## 2014-11-10 NOTE — Progress Notes (Signed)
G8P1 previous cs patient admitted to triage after a fall at 7am.  Pt states she "bumped" belly when falling. Did not land on belly.  States +Fm and denies LOF.  States VB (small) 2 days after OV with SVE.  Consent obtained and efm applied.

## 2014-11-10 NOTE — Progress Notes (Addendum)
Pt removed from EFM to BR to void and collect urine for U/A and culture.    1529 - Returned to bed and EFM.  Clean catch urine specimen obtained for U/A and culture-sent to lab.      1630 - Dr. Rosine DoorEspinosa at bedside to evaluate patient.  EFM tracing, labs, VS reviewed.  Pt to be discharged with follow up on Wed 11/12/2014 with Dr. Osie Bondyce.      [phone] - Written and verbal discharge instructions given to patient and her husband who state understanding and and compliance.      1640 - Removed from EFM.    1645 - IV d/ced    1710 - Pt discharged ambulatory accompanied by husband and daughter.

## 2014-11-10 NOTE — H&P (Signed)
History & Physical    Name: Stpehanie Montroy MRN: [id number]  SSN: [id number]    Date of Birth: [birth year]  Age: 24 y.o.  Sex: female      Subjective:     Reason for Admission:  Pregnancy and abdominal pain,abdominal trauma    History of Present Illness: Ms. Liao is a 24 y.o. African American female with an estimated gestational age of [redacted]w[redacted]d with Estimated Date of Delivery: 11/27/14. Patient complains of mild abdominal pain and mild contractions for 1 days. Pregnancy has been complicated by previous c section. Patient denies vaginal bleeding  and vaginal leaking of fluid .    OB History   Gravida Para Term Preterm AB SAB TAB Ectopic Multiple Living   # Outcome Date GA Lbr Len/2nd Weight Sex Delivery Anes PTL Lv   8 Current            7 TAB 06/22/08    U       6 TAB            5 TAB            4 TAB            3 TAB            2 TAB            1 Term  [redacted]w[redacted]d  3.2 kg F C-SECTION LO EPIDURAL AN N Y        History reviewed. No pertinent past medical history.  Past Surgical History   Procedure Laterality Date   ??? Pr cesarean delivery only       c/s Aug 28, 2009     Social History     Occupational History   ??? Not on file.     Social History Main Topics   ??? Smoking status: Current Every Day Smoker -- 0.25 packs/day   ??? Smokeless tobacco: Never Used   ??? Alcohol Use: No   ??? Drug Use: No   ??? Sexual Activity: No      Family History   Problem Relation Age of Onset   ??? Hypertension Mother    ??? Lupus Mother    ??? Hypertension Maternal Grandfather        Allergies   Allergen Reactions   ??? Latex, Natural Rubber Itching     Prior to Admission medications    Not on File        Review of Systems:  A comprehensive review of systems was negative except for that written in the History of Present Illness.     Objective:     Vitals:    Filed Vitals:    11/10/14 0954 11/10/14 1124 11/10/14 1317 11/10/14 1617   BP: 110/72 114/78  98/54   Pulse: 77 76 85 81   Temp:   98.2 ??F (36.8 ??C) 98 ??F (36.7 ??C)   Resp:   18 16    Height:       Weight:       SpO2: 99% 98% 99% 99%      Temp (24hrs), Avg:98.1 ??F (36.7 ??C), Min:98 ??F (36.7 ??C), Max:98.2 ??F (36.8 ??C)    BP  Min: 98/54  Max: 123/67     Physical Exam:  Cervical Exam: 2 cm dilated    50% effaced    -2 station    Presenting Part: cephalic  Cervical Position: posterior  Consistency: Medium     Membranes:  Intact  Uterine Activity:  irregular  Fetal Heart Rate:  Reactive  Baseline: 120 per minute       Lab/Data Review:  Recent Results (from the past 24 hour(s))   CBC WITH AUTOMATED DIFF    Collection Time: 11/10/14  1:16 PM   Result Value Ref Range    WBC 8.2 3.6 - 11.0 K/uL    RBC 4.02 3.80 - 5.20 M/uL    HGB 10.9 (L) 11.5 - 16.0 g/dL    HCT 16.133.3 (L) 09.635.0 - 47.0 %    MCV 82.8 80.0 - 99.0 FL    MCH 27.1 26.0 - 34.0 PG    MCHC 32.7 30.0 - 36.5 g/dL    RDW 04.513.1 40.911.5 - 81.114.5 %    PLATELET 245 150 - 400 K/uL    NEUTROPHILS 74 32 - 75 %    LYMPHOCYTES 22 12 - 49 %    MONOCYTES 4 (L) 5 - 13 %    EOSINOPHILS 0 0 - 7 %    BASOPHILS 0 0 - 1 %    ABS. NEUTROPHILS 6.0 1.8 - 8.0 K/UL    ABS. LYMPHOCYTES 1.8 0.8 - 3.5 K/UL    ABS. MONOCYTES 0.4 0.0 - 1.0 K/UL    ABS. EOSINOPHILS 0.0 0.0 - 0.4 K/UL    ABS. BASOPHILS 0.0 0.0 - 0.1 K/UL   URINALYSIS W/ REFLEX CULTURE    Collection Time: 11/10/14  3:21 PM   Result Value Ref Range    Color YELLOW/STRAW      Appearance CLEAR CLEAR      Specific gravity 1.013 1.003 - 1.030      pH (UA) 7.5 5.0 - 8.0      Protein NEGATIVE  NEG mg/dL    Glucose NEGATIVE  NEG mg/dL    Ketone NEGATIVE  NEG mg/dL    Bilirubin NEGATIVE  NEG      Blood NEGATIVE  NEG      Urobilinogen 0.2 0.2 - 1.0 EU/dL    Nitrites NEGATIVE  NEG      Leukocyte Esterase TRACE (A) NEG      UA:UC IF INDICATED CULTURE NOT INDICATED BY UA RESULT CNI      WBC 0-4 0 - 4 /hpf    RBC 0-5 0 - 5 /hpf    Epithelial cells FEW FEW /lpf    Bacteria NEGATIVE  NEG /hpf    Hyaline Cast 0-2 0 - 2 /lpf       Assessment and Plan:     Active Problems:    * No active hospital problems. *      Abdominal pain CTXS after falling ,IVF feeling better , will discharge home follow up 48 hours    Signed By:  Tracey HarriesBIALINES A Edith Lord, MD     November 10, 2014

## 2014-11-10 NOTE — Progress Notes (Signed)
Dr Bethena MidgetMead informed of pt arrival and status.  States to monitor x 2 hours then have MD check.

## 2014-11-10 NOTE — Progress Notes (Signed)
Care of patient assumed by L Pidcoe, RNC from Gwendolyn GrantK Marshall, RNC.  Pt received in bed resting comfortably on side.  Husband and daughter with patient.  Pt denies eating or drinking anything today.  Water provided.

## 2014-11-21 ENCOUNTER — Inpatient Hospital Stay
Admit: 2014-11-21 | Discharge: 2014-11-23 | Disposition: A | Discharge status: Home / Self Care | Payer: MEDICAID | Attending: Obstetrics & Gynecology | Admitting: Obstetrics & Gynecology

## 2014-11-21 DIAGNOSIS — O3421 Maternal care for scar from previous cesarean delivery: Secondary | ICD-10-CM

## 2014-11-21 LAB — CBC WITH AUTOMATED DIFF
ABS. BASOPHILS: 0 10*3/uL (ref 0.0–0.1)
ABS. EOSINOPHILS: 0 10*3/uL (ref 0.0–0.4)
ABS. LYMPHOCYTES: 1.5 10*3/uL (ref 0.8–3.5)
ABS. MONOCYTES: 0.4 10*3/uL (ref 0.0–1.0)
ABS. NEUTROPHILS: 7.6 10*3/uL (ref 1.8–8.0)
BASOPHILS: 0 % (ref 0–1)
EOSINOPHILS: 0 % (ref 0–7)
HCT: 34.6 % — ABNORMAL LOW (ref 35.0–47.0)
HGB: 11.5 g/dL (ref 11.5–16.0)
LYMPHOCYTES: 16 % (ref 12–49)
MCH: 27.2 PG (ref 26.0–34.0)
MCHC: 33.2 g/dL (ref 30.0–36.5)
MCV: 81.8 FL (ref 80.0–99.0)
MONOCYTES: 5 % (ref 5–13)
NEUTROPHILS: 79 % — ABNORMAL HIGH (ref 32–75)
PLATELET: 260 10*3/uL (ref 150–400)
RBC: 4.23 M/uL (ref 3.80–5.20)
RDW: 13.5 % (ref 11.5–14.5)
WBC: 9.5 10*3/uL (ref 3.6–11.0)

## 2014-11-21 LAB — TYPE & SCREEN
ABO/Rh(D): A POS
Antibody screen: NEGATIVE

## 2014-11-21 LAB — TYPE AND SCREEN
ABO/Rh: A POS
Antibody Screen: NEGATIVE

## 2014-11-21 MED ORDER — MORPHINE (PF) 0.5 MG/ML IJ SOLN
0.5 mg/mL | INTRAMUSCULAR | Status: DC | PRN
Start: 2014-11-21 — End: 2014-11-21
  Administered 2014-11-21: 17:00:00 via INTRATHECAL

## 2014-11-21 MED ORDER — SIMETHICONE 80 MG CHEWABLE TAB
80 mg | ORAL | Status: DC | PRN
Start: 2014-11-21 — End: 2014-11-23

## 2014-11-21 MED ORDER — OXYTOCIN 10 UNIT/ML INJECTION
10 unit/mL | INTRAMUSCULAR | Status: AC
Start: 2014-11-21 — End: ?

## 2014-11-21 MED ORDER — SODIUM CHLORIDE 0.9 % IV PIGGY BACK
1 gram | Freq: Once | INTRAVENOUS | Status: AC
Start: 2014-11-21 — End: 2014-11-21
  Administered 2014-11-21: 17:00:00 via INTRAVENOUS

## 2014-11-21 MED ORDER — ONDANSETRON (PF) 4 MG/2 ML INJECTION
4 mg/2 mL | INTRAMUSCULAR | Status: AC
Start: 2014-11-21 — End: ?

## 2014-11-21 MED ORDER — SODIUM CHLORIDE 0.9 % IJ SYRG
Freq: Three times a day (TID) | INTRAMUSCULAR | Status: DC
Start: 2014-11-21 — End: 2014-11-21

## 2014-11-21 MED ORDER — OXYTOCIN 20 UNITS/1000 ML IN LACTATED RINGERS IV
20 unit/1,000 mL | INTRAVENOUS | Status: DC | PRN
Start: 2014-11-21 — End: 2014-11-21
  Administered 2014-11-21: 18:00:00 via INTRAVENOUS

## 2014-11-21 MED ORDER — OXYCODONE-ACETAMINOPHEN 5 MG-325 MG TAB
5-325 mg | ORAL | Status: DC | PRN
Start: 2014-11-21 — End: 2014-11-23
  Administered 2014-11-22 – 2014-11-23 (×3): via ORAL

## 2014-11-21 MED ORDER — ZOLPIDEM 5 MG TAB
5 mg | Freq: Every evening | ORAL | Status: DC | PRN
Start: 2014-11-21 — End: 2014-11-23

## 2014-11-21 MED ORDER — MORPHINE (PF) 0.5 MG/ML IJ SOLN
0.5 mg/mL | INTRAMUSCULAR | Status: AC
Start: 2014-11-21 — End: ?

## 2014-11-21 MED ORDER — KETOROLAC TROMETHAMINE 30 MG/ML INJECTION
30 mg/mL (1 mL) | INTRAMUSCULAR | Status: AC
Start: 2014-11-21 — End: 2014-11-21
  Administered 2014-11-21: 19:00:00

## 2014-11-21 MED ORDER — NALOXONE 0.4 MG/ML INJECTION
0.4 mg/mL | INTRAMUSCULAR | Status: DC | PRN
Start: 2014-11-21 — End: 2014-11-23

## 2014-11-21 MED ORDER — LACTATED RINGERS IV
INTRAVENOUS | Status: DC
Start: 2014-11-21 — End: 2014-11-21

## 2014-11-21 MED ORDER — EPHEDRINE SULFATE 50 MG/ML IJ SOLN
50 mg/mL | INTRAMUSCULAR | Status: AC
Start: 2014-11-21 — End: 2014-11-21

## 2014-11-21 MED ORDER — SODIUM CHLORIDE 0.9 % IJ SYRG
Freq: Three times a day (TID) | INTRAMUSCULAR | Status: DC
Start: 2014-11-21 — End: 2014-11-23

## 2014-11-21 MED ORDER — MEASLES,MUMPS&RUBELLA VACCIN(PF) 1,000-12,500 TCID50/0.5 ML SUB-Q SUSP
1000-12500 TCID50/0.5 mL | SUBCUTANEOUS | Status: DC
Start: 2014-11-21 — End: 2014-11-21

## 2014-11-21 MED ORDER — LACTATED RINGERS IV
INTRAVENOUS | Status: DC
Start: 2014-11-21 — End: 2014-11-21
  Administered 2014-11-21 (×2): via INTRAVENOUS

## 2014-11-21 MED ORDER — SODIUM CHLORIDE 0.9 % IJ SYRG
INTRAMUSCULAR | Status: DC | PRN
Start: 2014-11-21 — End: 2014-11-21

## 2014-11-21 MED ORDER — ONDANSETRON (PF) 4 MG/2 ML INJECTION
4 mg/2 mL | INTRAMUSCULAR | Status: DC | PRN
Start: 2014-11-21 — End: 2014-11-21
  Administered 2014-11-21: 18:00:00 via INTRAVENOUS

## 2014-11-21 MED ORDER — CEFAZOLIN 2 G IN 100 ML 0.9% NS
2 gram/100 mL | INTRAVENOUS | Status: AC
Start: 2014-11-21 — End: 2014-11-21
  Administered 2014-11-21: 17:00:00

## 2014-11-21 MED ORDER — ACETAMINOPHEN 325 MG TABLET
325 mg | ORAL | Status: DC | PRN
Start: 2014-11-21 — End: 2014-11-23

## 2014-11-21 MED ORDER — BUPIVACAINE (PF) 0.75 % (7.5 MG/ML) IJ SOLN
0.75 % (7.5 mg/mL) | INTRAMUSCULAR | Status: DC | PRN
Start: 2014-11-21 — End: 2014-11-21
  Administered 2014-11-21: 17:00:00 via INTRATHECAL

## 2014-11-21 MED ORDER — LIDOCAINE-EPINEPHRINE PF 2 %-1:200,000 IJ SOLN
2 %-1:00,000 | INTRAMUSCULAR | Status: AC
Start: 2014-11-21 — End: 2014-11-21
  Administered 2014-11-21: 17:00:00

## 2014-11-21 MED ORDER — OXYTOCIN 20 UNITS/1000 ML IN LACTATED RINGERS IV
20 unit/1,000 mL | Freq: Once | INTRAVENOUS | Status: AC
Start: 2014-11-21 — End: 2014-11-21
  Administered 2014-11-22: via INTRAVENOUS

## 2014-11-21 MED ORDER — RHO D IMMUNE GLOBULIN 300 MCG IM SYRG
1500 unit (300 mcg) | Freq: Once | INTRAMUSCULAR | Status: DC | PRN
Start: 2014-11-21 — End: 2014-11-21

## 2014-11-21 MED ORDER — OXYTOCIN 10 UNIT/ML INJECTION
10 unit/mL | INTRAMUSCULAR | Status: AC
Start: 2014-11-21 — End: 2014-11-21
  Administered 2014-11-21: 17:00:00

## 2014-11-21 MED ORDER — SODIUM CHLORIDE 0.9 % IJ SYRG
INTRAMUSCULAR | Status: DC | PRN
Start: 2014-11-21 — End: 2014-11-23

## 2014-11-21 MED FILL — OXYTOCIN 20 UNITS/1000 ML IN LACTATED RINGERS IV: 20 unit/1,000 mL | INTRAVENOUS | Qty: 1000

## 2014-11-21 MED FILL — XYLOCAINE-MPF/EPINEPHRINE 2 %-1:200,000 INJECTION SOLUTION: 2 %-1:00,000 | INTRAMUSCULAR | Qty: 20

## 2014-11-21 MED FILL — DURAMORPH (PF) 0.5 MG/ML INJECTION SOLUTION: 0.5 mg/mL | INTRAMUSCULAR | Qty: 10

## 2014-11-21 MED FILL — OXYTOCIN 10 UNIT/ML INJECTION: 10 unit/mL | INTRAMUSCULAR | Qty: 2

## 2014-11-21 MED FILL — CEFAZOLIN 2 G IN 100 ML 0.9% NS: 2 gram/100 mL | INTRAVENOUS | Qty: 100

## 2014-11-21 MED FILL — ONDANSETRON (PF) 4 MG/2 ML INJECTION: 4 mg/2 mL | INTRAMUSCULAR | Qty: 2

## 2014-11-21 MED FILL — LACTATED RINGERS IV: INTRAVENOUS | Qty: 1000

## 2014-11-21 MED FILL — KETOROLAC TROMETHAMINE 30 MG/ML INJECTION: 30 mg/mL (1 mL) | INTRAMUSCULAR | Qty: 1

## 2014-11-21 MED FILL — EPHEDRINE SULFATE 50 MG/ML IJ SOLN: 50 mg/mL | INTRAMUSCULAR | Qty: 1

## 2014-11-21 NOTE — Anesthesia Pre-Procedure Evaluation (Signed)
 Formatting of this note is different from the original.  Anesthetic History   No history of anesthetic complications        Review of Systems / Medical History  Patient summary reviewed, nursing notes reviewed and pertinent labs reviewed    Pulmonary    Smoker    Comments: Smoker - 0.25 ppd   Neuro/Psych     Psychiatric history     Cardiovascular  Within defined limits    Exercise tolerance: >4 METS    GI/Hepatic/Renal  Within defined limits      Endo/Other  Within defined limits     Other Findings   Comments: Previous Cesarean Section (08/28/09)  Chlamydia  Gonorrhea     Physical Exam    Airway  Mallampati: I  TM Distance: > 6 cm  Neck ROM: normal range of motion   Mouth opening: Normal     Cardiovascular  Regular rate and rhythm,  S1 and S2 normal,  no murmur, click, rub, or gallop     Dental  No notable dental hx      Pulmonary  Breath sounds clear to auscultation     Abdominal  GI exam deferred     Other Findings       Anesthetic Plan    ASA: 2  Anesthesia type: spinal    Post-op pain plan if not by surgeon: intrathecal opiates    Anesthetic plan and risks discussed with: Patient          Electronically signed by Kristina Olan HERO, MD at 11/21/2014 12:03 PM EST

## 2014-11-21 NOTE — Anesthesia Post-Procedure Evaluation (Signed)
 Formatting of this note is different from the original.  Post-Anesthesia Evaluation and Assessment    Patient: Amanda Little MRN: [id number]  SSN: [id number]    Date of Birth: May 05, 1991  Age: 24 y.o.  Sex: female      Cardiovascular Function/Vital Signs  Visit Vitals   Item Reading   ? BP 126/79 mmHg   ? Pulse 107   ? Temp 36.4 C (97.5 F)   ? Resp 21   ? Ht 5' 2 (1.575 m)   ? Wt 64.864 kg (143 lb)   ? BMI 26.15 kg/m2   ? SpO2 99%   ? Breastfeeding Unknown     Patient is status post spinal anesthesia for Procedure(s):  CESAREAN SECTION.    Nausea/Vomiting: None    Postoperative hydration reviewed and adequate.    Pain:  Pain Scale 1: Numeric (0 - 10) (11/21/14 1209)  Pain Intensity 1: 7 (11/21/14 1209)   Managed    Neurological Status:     At baseline    Mental Status and Level of Consciousness: Alert and oriented     Pulmonary Status:   O2 Device: Room air (11/21/14 1319)   Adequate oxygenation and airway patent    Complications related to anesthesia: None    Post-anesthesia assessment completed. No concerns    Signed By: OLAN CHRISTELLA PRO, MD     November 21, 2014          Electronically signed by PRO OLAN CHRISTELLA, MD at 11/21/2014  1:31 PM EST

## 2014-11-21 NOTE — Procedures (Signed)
Cesarean Section Delivery Operative Report     Date of Surgery: 11/21/2014     Preoperative Diagnosis: repeat cesarean section; term pregnancy; polyhydramnios    Postoperative Diagnosis: repeat c-section; viable female infant    Procedure: Procedure(s):  CESAREAN SECTION    Surgeon(s) and Role:     * Rennis Pettyaymonda B Jamion Carter, MD - Primary     * Precious ReelEverett D Barnes, MD - Assisting     Anesthesia: Spinal    Procedure Detail:    The patient was taken to the operating room, where spinal anesthesia was found to be adequate.  The patient was prepped and draped in the normal sterile fashion. Pfannenstiel skin incision was made at the prior skin incision with the scalpel and carried down to the underlying fascia with the bovie. The fascial incision was extended laterally with Mayo scissors.  The fascia was grossly adhered to the underlying rectus abdominus muscles.   This fascial incision was grasped with Kocher clamps, tented up, and the underlying rectus muscles were dissected bluntly and sharply dissected with curve mayo scissors. The inferior edge of the rectus fascia was grasped with Kocher clamps, tented up, and the underlying rectus muscle was dissected off bluntly. The rectus muscle was divided in the midline bluntly. The peritoneum was entered bluntly and the abdominal cavity was stretched. The bladder blade was then inserted. The vesicouterine and peritoneum was identified, grasped with pick-ups and entered sharply with Metzenbaum scissors. The bladder flap was then created digitally and the bladder blade was reinserted. A low transverse uterine incision was made with the scalpel and extended laterally with blunt finger dissection. Amniotomy occurred there appeared to be meconium.  The baby???s head was then delivered atraumatically in OA position.  There was a loose nuchal cord.  The nose and mouth were suctioned. The cord was clamped and cut and the baby was handed off to the waiting neonatal care unit staff.  The deliver time  was 12:35 with 9, 9 apgars. Placenta was then delivered with assisted delivery. The uterus was exteriorized and cleared of all clots and debris. The uterus was abnormal in shape there appeared to development of the fetus on the right side of the uterus in a possible uterine horn.  The left side of the uterus was much smaller with a possible rudimentary horn.  The uterine incision was closed in two layers. The first layer with running locked layer of 0 Vicryl. The second layer was an imbricating layer of 0 Vicryl with good hemostasis assured. The pelvis was washed with warm normal saline. Good hemostasis was again reassured.  The paracolic gutters were washed with warm normal saline and the uterus was returned to the abdomen. Good hemostasis was again reassured throughout.  The fascia was closed with 0 Vicryl in a running fashion. Good hemostasis was assured. The subcuticular layers were reapproximated with 3-0 vicryl in interrupted fashion. The skin was closed with a 4-0 Vicryl in a subcuticular fashion. The patient tolerated the procedure well. Sponge, lap, and needle counts were correct times two and the patient was taken to recovery in stable condition.      Estimated Blood Loss:  600  IVF 1300  UOP 200    Specimens: * No specimens in log *     Cord Blood Results:  Information for the patient's newborn:  Yetta BarreJones, Girl [[id number]][[id number]]   No results found for: PCTABR, PCTDIG, BILI, ABORH    No results found for: APH, APCO2, APO2, AHCO3, ABEC, ABDC, O2ST, SITE,  RSCOM     Prenatal Labs:  Lab Results   Component Value Date/Time    ABO/RH(D) A POSITIVE 11/21/2014 10:30 AM    HBSAG, EXTERNAL neg 03/24/2014    HIV, EXTERNAL non reactive 03/24/2014    RUBELLA, EXTERNAL Immune 03/24/2014    RPR, EXTERNAL non reactive 03/24/2014    GONORRHEA, EXTERNAL Neg. 01/16/2009    CHLAMYDIA, EXTERNAL Neg. 01/16/2009    GRBSTREP, EXTERNAL neg 10/24/2014        Signed By:  Rennis Petty, MD     November 21, 2014

## 2014-11-21 NOTE — Anesthesia Procedure Notes (Signed)
Spinal Block    Start time: 11/21/2014 12:14 PM  End time: 11/21/2014 12:18 PM  Reason for block: primary anesthetic  Staffing  Resident/CRNA: MAPILE, DAVID C  Performed by: CRNA   Prep  Risks and benefits discussed with the patient and plans are to proceed   Site marked, Timeout performed, 12:14  Patient was placed in seated position  The back was prepped at the lumbar region  Prep Solution(s): DuraPrep  Injection Technique: single-shot  Spinal  Spinal Location: T4-5  Needle: 25G Pencan  Attempts: needle passed 1 time(s)  CSF confirmed, no blood with aspiration and no paresthesia  Assessment  Insertion was uncomplicated and patient tolerated without any apparent complications  Additional Notes  Spinal Procedure Note    3 mL 1% lidocaine was used as local.  1.4 mL 0.75% Spinal Marcaine with dextrose + 0.5 mg Duramorph was deposited into the CSF.

## 2014-11-21 NOTE — Anesthesia Procedure Notes (Signed)
 Associated Order(s): ANESTHESIA SPINAL BLOCK  Formatting of this note might be different from the original.  Spinal Block    Start time: 11/21/2014 12:14 PM  End time: 11/21/2014 12:18 PM  Reason for block: primary anesthetic  Staffing  Resident/CRNA: MAPILE, DAVID C  Performed by: CRNA   Prep  Risks and benefits discussed with the patient and plans are to proceed   Site marked, Timeout performed, 12:14  Patient was placed in seated position  The back was prepped at the lumbar region  Prep Solution(s): DuraPrep  Injection Technique: single-shot  Spinal  Spinal Location: T4-5  Needle: 25G Pencan  Attempts: needle passed 1 time(s)  CSF confirmed, no blood with aspiration and no paresthesia  Assessment  Insertion was uncomplicated and patient tolerated without any apparent complications  Additional Notes  Spinal Procedure Note    3 mL 1% lidocaine was used as local.  1.4 mL 0.75% Spinal Marcaine with dextrose + 0.5 mg Duramorph was deposited into the CSF.       Electronically signed by Veronica Alm BROCKS, CRNA at 11/21/2014 12:22 PM EST

## 2014-11-21 NOTE — Anesthesia Post-Procedure Evaluation (Signed)
Post-Anesthesia Evaluation and Assessment    Patient: Amanda Little MRN: [id number]  SSN: [id number]    Date of Birth: 1991-10-11  Age: 24 y.o.  Sex: female       Cardiovascular Function/Vital Signs  Visit Vitals   Item Reading   ??? BP 126/79 mmHg   ??? Pulse 107   ??? Temp 36.4 ??C (97.5 ??F)   ??? Resp 21   ??? Ht 5\' 2"  (1.575 m)   ??? Wt 64.864 kg (143 lb)   ??? BMI 26.15 kg/m2   ??? SpO2 99%   ??? Breastfeeding Unknown       Patient is status post spinal anesthesia for Procedure(s):  CESAREAN SECTION.    Nausea/Vomiting: None    Postoperative hydration reviewed and adequate.    Pain:  Pain Scale 1: Numeric (0 - 10) (11/21/14 1209)  Pain Intensity 1: 7 (11/21/14 1209)   Managed    Neurological Status:       At baseline    Mental Status and Level of Consciousness: Alert and oriented     Pulmonary Status:   O2 Device: Room air (11/21/14 1319)   Adequate oxygenation and airway patent    Complications related to anesthesia: None    Post-anesthesia assessment completed. No concerns    Signed By: Atilano InaONAN M Sonya Pucci, MD     November 21, 2014

## 2014-11-21 NOTE — Procedures (Signed)
Cesarean Section Delivery Operative Report     Date of Surgery: 11/21/2014     Preoperative Diagnosis: repeat cesarean section; term pregnancy; polyhydramnios    Postoperative Diagnosis: repeat c-section; viable female infant    Procedure: Procedure(s):  CESAREAN SECTION    Surgeon(s) and Role:     * Rennis Pettyaymonda B Mickaela Starlin, MD - Primary     * Precious ReelEverett D Barnes, MD - Assisting     Anesthesia: Spinal    Procedure Detail:    The patient was taken to the operating room, where spinal anesthesia was found to be adequate.  The patient was prepped and draped in the normal sterile fashion. Pfannenstiel skin incision was made at the prior skin incision with the scalpel and carried down to the underlying fascia with the bovie. The fascial incision was extended laterally with Mayo scissors.  The fascia was grossly adhered to the underlying rectus abdominus muscles.   This fascial incision was grasped with Kocher clamps, tented up, and the underlying rectus muscles were dissected bluntly and sharply dissected with curve mayo scissors. The inferior edge of the rectus fascia was grasped with Kocher clamps, tented up, and the underlying rectus muscle was dissected off bluntly. The rectus muscle was divided in the midline bluntly. The peritoneum was entered bluntly and the abdominal cavity was stretched. The bladder blade was then inserted. The vesicouterine and peritoneum was identified, grasped with pick-ups and entered sharply with Metzenbaum scissors. The bladder flap was then created digitally and the bladder blade was reinserted. A low transverse uterine incision was made with the scalpel and extended laterally with blunt finger dissection. Amniotomy occurred there appeared to be meconium.  The baby???s head was then delivered atraumatically in OA position.  There was a loose nuchal cord.  The nose and mouth were suctioned. The cord was clamped and cut and the baby was handed off to the waiting neonatal care  unit staff.  The deliver time was 12:35 with 9, 9 apgars. Placenta was then delivered with assisted delivery. The uterus was exteriorized and cleared of all clots and debris. The uterus was abnormal in shape there appeared to development of the fetus on the right side of the uterus in a possible uterine horn.  The left side of the uterus was much smaller with a possible rudimentary horn.  The uterine incision was closed in two layers. The first layer with running locked layer of 0 Vicryl. The second layer was an imbricating layer of 0 Vicryl with good hemostasis assured. The pelvis was washed with warm normal saline. Good hemostasis was again reassured.  The paracolic gutters were washed with warm normal saline and the uterus was returned to the abdomen. Good hemostasis was again reassured throughout.  The fascia was closed with 0 Vicryl in a running fashion. Good hemostasis was assured. The subcuticular layers were reapproximated with 3-0 vicryl in interrupted fashion. The skin was closed with a 4-0 Vicryl in a subcuticular fashion. The patient tolerated the procedure well. Sponge, lap, and needle counts were correct times two and the patient was taken to recovery in stable condition.      Estimated Blood Loss:  600  IVF 1300  UOP 200    Specimens: * No specimens in log *     Cord Blood Results:  Information for the patient's newborn:  Yetta BarreJones, Girl [[id number]][[id number]]   No results found for: PCTABR, PCTDIG, BILI, ABORH    No results found for: APH, APCO2, APO2, AHCO3, ABEC, ABDC, O2ST, SITE,  RSCOM     Prenatal Labs:  Lab Results   Component Value Date/Time    ABO/RH(D) A POSITIVE 11/21/2014 10:30 AM    HBSAG, EXTERNAL neg 03/24/2014    HIV, EXTERNAL non reactive 03/24/2014    RUBELLA, EXTERNAL Immune 03/24/2014    RPR, EXTERNAL non reactive 03/24/2014    GONORRHEA, EXTERNAL Neg. 01/16/2009    CHLAMYDIA, EXTERNAL Neg. 01/16/2009    GRBSTREP, EXTERNAL neg 10/24/2014        Signed By:  Rennis Petty, MD      November 21, 2014

## 2014-11-21 NOTE — Progress Notes (Signed)
Patient has tested positive for gonorrhea 11/14/2014, patient was treated with IM Rocephin.

## 2014-11-21 NOTE — Progress Notes (Signed)
Message left on Dr. Otilio Saberyces cell phone, patient asking to see MD, discussed with patient MD will be in to see here before the scheduled section.

## 2014-11-21 NOTE — Anesthesia Procedure Notes (Signed)
Spinal Block    Start time: 11/21/2014 12:14 PM  End time: 11/21/2014 12:18 PM  Reason for block: primary anesthetic  Staffing  Resident/CRNA: Violette Morneault C  Performed by: CRNA   Prep  Risks and benefits discussed with the patient and plans are to proceed   Site marked, Timeout performed, 12:14  Patient was placed in seated position  The back was prepped at the lumbar region  Prep Solution(s): DuraPrep  Injection Technique: single-shot  Spinal  Spinal Location: T4-5  Needle: 25G Pencan  Attempts: needle passed 1 time(s)  CSF confirmed, no blood with aspiration and no paresthesia  Assessment  Insertion was uncomplicated and patient tolerated without any apparent complications  Additional Notes  Spinal Procedure Note    3 mL 1% lidocaine was used as local.  1.4 mL 0.75% Spinal Marcaine with dextrose + 0.5 mg Duramorph was deposited into the CSF.

## 2014-11-21 NOTE — Progress Notes (Signed)
Dr. Osie Bondyce in discussing plan of care with patient and s/o.

## 2014-11-21 NOTE — Progress Notes (Signed)
Dr. Osie Bondyce contacted, MD will be in to see patient within 10 min.

## 2014-11-21 NOTE — Progress Notes (Signed)
TRANSFER - OUT REPORT:    Verbal report given to N Rice RN(name) on Amanda Little  being transferred to MIU(unit) for routine progression of care       Report consisted of patient???s Situation, Background, Assessment and   Recommendations(SBAR).     Information from the following report(s) SBAR, Procedure Summary, Intake/Output, MAR, Accordion and Recent Results was reviewed with the receiving nurse.    Lines:   Peripheral IV 11/21/14 Right Forearm (Active)   Site Assessment Clean, dry, & intact 11/21/2014  2:04 PM   Phlebitis Assessment 0 11/21/2014  2:04 PM   Infiltration Assessment 0 11/21/2014  2:04 PM   Dressing Status Clean, dry, & intact;Occlusive 11/21/2014  2:04 PM   Dressing Type Tape;Transparent 11/21/2014  2:04 PM   Hub Color/Line Status Green 11/21/2014  2:04 PM        Opportunity for questions and clarification was provided.

## 2014-11-21 NOTE — Progress Notes (Signed)
Dr. Lewis MoccasinMcDonough in talking with patient.

## 2014-11-21 NOTE — Anesthesia Pre-Procedure Evaluation (Addendum)
Anesthetic History   No history of anesthetic complications            Review of Systems / Medical History  Patient summary reviewed, nursing notes reviewed and pertinent labs reviewed    Pulmonary          Smoker      Comments: Smoker - 0.25 ppd   Neuro/Psych         Psychiatric history     Cardiovascular  Within defined limits                Exercise tolerance: >4 METS     GI/Hepatic/Renal  Within defined limits              Endo/Other  Within defined limits           Other Findings   Comments: Previous Cesarean Section (08/28/09)  Chlamydia  Gonorrhea         Physical Exam    Airway  Mallampati: I  TM Distance: > 6 cm  Neck ROM: normal range of motion   Mouth opening: Normal     Cardiovascular  Regular rate and rhythm,  S1 and S2 normal,  no murmur, click, rub, or gallop             Dental  No notable dental hx       Pulmonary  Breath sounds clear to auscultation               Abdominal  GI exam deferred       Other Findings            Anesthetic Plan    ASA: 2  Anesthesia type: spinal      Post-op pain plan if not by surgeon: intrathecal opiates      Anesthetic plan and risks discussed with: Patient

## 2014-11-21 NOTE — H&P (Signed)
History & Physical    Name: Amanda Little MRN: [id number]  SSN: [id number]    Date of Birth: 05-02-1991  Age: 24 y.o.  Sex: female        Subjective:     Estimated Date of Delivery: 11/27/14  OB History   Gravida Para Term Preterm AB SAB TAB Ectopic Multiple Living   # Outcome Date GA Lbr Len/2nd Weight Sex Delivery Anes PTL Lv   8 Current            7 TAB 06/22/08    U       6 TAB            5 TAB            4 TAB            3 TAB            2 TAB            1 Term  [redacted]w[redacted]d  3.2 kg F C-SECTION LO EPIDURAL AN N Y          [name] is admitted with pregnancy at [redacted]w[redacted]d for repeat Cesarean Section. Prenatal course was complicated by polyhydramnios and STIs. Please see prenatal records for details.    History reviewed. No pertinent past medical history.  Past Surgical History   Procedure Laterality Date   ??? Pr cesarean delivery only       c/s Aug 28, 2009     Social History     Occupational History   ??? Not on file.     Social History Main Topics   ??? Smoking status: Current Every Day Smoker -- 0.25 packs/day   ??? Smokeless tobacco: Never Used   ??? Alcohol Use: No   ??? Drug Use: No   ??? Sexual Activity: No     Family History   Problem Relation Age of Onset   ??? Hypertension Mother    ??? Lupus Mother    ??? Hypertension Maternal Grandfather        Allergies   Allergen Reactions   ??? Latex, Natural Rubber Itching     Prior to Admission medications    Not on File        Review of Systems: A comprehensive review of systems was negative except for that written in the HPI.    Objective:     Vitals:  There were no vitals filed for this visit.     Physical Exam:  Patient without distress.  Abdomen: soft, nontender  Fundus: soft and non tender  Membranes:  Intact  Fetal Heart Rate: 130 MV accels    Prenatal Labs:   Lab Results   Component Value Date/Time    RUBELLA, EXTERNAL Immune 03/24/2014    GRBSTREP, EXTERNAL neg 10/24/2014    HBSAG, EXTERNAL neg 03/24/2014    HIV, EXTERNAL non reactive 03/24/2014     RPR, EXTERNAL non reactive 03/24/2014    GONORRHEA, EXTERNAL Neg. 01/16/2009    CHLAMYDIA, EXTERNAL Neg. 01/16/2009    ABO,RH A pos 03/24/2014         Assessment/Plan:     Active Problems:    * No active hospital problems. *       Plan: Admit for Proceed with Cesarean Section Reassuring fetal status.  Risks of bleeding, infection, bladder and bowel damage explained to patient.  They understand the situation and consent  to the cesarean delivery.      .  Group B Strep was negative.    Signed By:  Rennis Pettyaymonda B Jerra Huckeby, MD     November 21, 2014

## 2014-11-21 NOTE — Progress Notes (Signed)
Admit this 24 yo G 8 P 1 for repeat c-section, patient is allergic to Latex. Consents signed and witnessed, patient and s/o oriented to room.

## 2014-11-21 NOTE — Lactation Note (Addendum)
This note was copied from the chart of Amanda Little.  Attempted to breast feed after delivery.Latched with nurse Joni ReiningNicole assistance but no sucking.   Consultation at 1500. Assist with positioning in cradle hold.Suggest mother use cross cradle but mom not comfortable with hold post C/S. Held baby at breast x 5 minutes for good sucking x 3 minutes.Manually expressed colostrum to encourage latch. Oral assessment reveals high slightly narrow palate but intact. Reviewed deep latch with mother. Left baby in skin to skin and encouraged frequent feedings. Watch for feeding cues.   .Feeding frequency and length of feedings explained. Infant output explained as to age of baby.Supply and demand discussed.Feeding log initiated by LC.

## 2014-11-21 NOTE — Lactation Note (Signed)
Discussed with mother her plan for feeding.  Reviewed the benefits of exclusive breast milk feeding during the hospital stay.  Informed mother of the risks of using formula to supplement in the first few days of life as well as the benefits of successful breast milk feeding; referred mother to the handout in her admission packet related to these topics.  Mother acknowledges understanding of information reviewed and states that it is her plan tobreast milk feed exclusively her infant.  Will support her choice and offer additional information as needed.

## 2014-11-22 LAB — CBC WITH AUTOMATED DIFF
ABS. BASOPHILS: 0 10*3/uL (ref 0.0–0.1)
ABS. EOSINOPHILS: 0 10*3/uL (ref 0.0–0.4)
ABS. LYMPHOCYTES: 1.3 10*3/uL (ref 0.8–3.5)
ABS. MONOCYTES: 0.5 10*3/uL (ref 0.0–1.0)
ABS. NEUTROPHILS: 10 10*3/uL — ABNORMAL HIGH (ref 1.8–8.0)
BASOPHILS: 0 % (ref 0–1)
EOSINOPHILS: 0 % (ref 0–7)
HCT: 29.3 % — ABNORMAL LOW (ref 35.0–47.0)
HGB: 9.8 g/dL — ABNORMAL LOW (ref 11.5–16.0)
LYMPHOCYTES: 11 % — ABNORMAL LOW (ref 12–49)
MCH: 27.5 PG (ref 26.0–34.0)
MCHC: 33.4 g/dL (ref 30.0–36.5)
MCV: 82.3 FL (ref 80.0–99.0)
MONOCYTES: 4 % — ABNORMAL LOW (ref 5–13)
NEUTROPHILS: 85 % — ABNORMAL HIGH (ref 32–75)
PLATELET: 219 10*3/uL (ref 150–400)
RBC: 3.56 M/uL — ABNORMAL LOW (ref 3.80–5.20)
RDW: 13.4 % (ref 11.5–14.5)
WBC: 11.7 10*3/uL — ABNORMAL HIGH (ref 3.6–11.0)

## 2014-11-22 MED ORDER — LACTATED RINGERS IV
INTRAVENOUS | Status: DC
Start: 2014-11-22 — End: 2014-11-23
  Administered 2014-11-22 (×2): via INTRAVENOUS

## 2014-11-22 MED ORDER — DIPHTH,PERTUS(AC)TETANUS VAC(PF) 2.5 LF UNIT-8 MCG-5 LF/0.5 ML INJ
INTRAMUSCULAR | Status: AC
Start: 2014-11-22 — End: 2014-11-22
  Administered 2014-11-23: 03:00:00 via INTRAMUSCULAR

## 2014-11-22 MED ORDER — IBUPROFEN 400 MG TAB
400 mg | Freq: Three times a day (TID) | ORAL | Status: DC
Start: 2014-11-22 — End: 2014-11-23
  Administered 2014-11-22 – 2014-11-23 (×3): via ORAL

## 2014-11-22 MED FILL — LACTATED RINGERS IV: INTRAVENOUS | Qty: 1000

## 2014-11-22 MED FILL — OXYCODONE-ACETAMINOPHEN 5 MG-325 MG TAB: 5-325 mg | ORAL | Qty: 1

## 2014-11-22 MED FILL — IBUPROFEN 400 MG TAB: 400 mg | ORAL | Qty: 2

## 2014-11-22 NOTE — Lactation Note (Addendum)
This note was copied from the chart of Girl Schmiesing.  24 hour progress note  10 x at breast, most 8-9 latch scoring with improvement with mother's confidence and hold/post op C/S  2 wet 2 stool.  Am wt assessed at -3.11%  Will continue to follow and encourage mother's exclusive breastfeeding goals.   1315 Breast fed x 30 minutes, baby led from start to finish. Mother is comfortable without questions or concerns.   Provided Insurance Benefit for Breast pump handout resources. LC # provided.   Expect success. Call prn.

## 2014-11-22 NOTE — Progress Notes (Signed)
Ambulated to commode with nurse at side to change pad and do pericare. Re-oriented to pericare. Verbalizes understanding and willingness. Gait even and steady. Denies light headedness and dizziness. Returned to bed without difficulty.

## 2014-11-22 NOTE — Other (Signed)
Bedside and Verbal shift change report given to N. Rice,RN (oncoming nurse) by A.Adam,RNc-MNN (offgoing nurse). Report included the following information SBAR, Procedure Summary, Intake/Output, MAR and Recent Results. ??   ??

## 2014-11-22 NOTE — Progress Notes (Signed)
Pt taken off protocol, IV fluids stopped, foley cath discontinued. Dermabond to incision open to air. Pt up to the shower without complaint.

## 2014-11-22 NOTE — Other (Signed)
Bedside and Verbal shift change report given to A.Adam,RNC-MNN (oncoming nurse) by N.Rice,RN (offgoing nurse). Report included the following information SBAR, Procedure Summary, Intake/Output, MAR and Recent Results. ??   ??

## 2014-11-22 NOTE — Progress Notes (Signed)
Ambulated to commode with nurse at side. Gait even and steady. Denies light headedness and dizziness. Oriented to self pericare. Returned to bed without difficulty. SCDs re-applied and fully operational.

## 2014-11-22 NOTE — Progress Notes (Signed)
Intrathecal/Epidural DuraMorphFollow-up Note    1 Day Post-Op sp Procedure(s):  CESAREAN SECTION.    BP 112/65 mmHg   Pulse 82   Temp(Src) 36.9 ??C (98.5 ??F)   Resp 16   Ht 5\' 2"  (1.575 m)   Wt 64.864 kg (143 lb)   BMI 26.15 kg/m2   SpO2 99%   LMP 02/11/2014 (Approximate)   Breastfeeding? Unknown.      Pain is well controlled with DuraMorph.  Pain management as per primary service.

## 2014-11-22 NOTE — Progress Notes (Signed)
Post-Operative Day Number 1 Progress Note    Patient doing well post-op day 1 from cesarean delivery without significant complaints.  Pain controlled on current medication.  Voiding without difficulty, normal lochia.    Vitals:  Patient Vitals for the past 8 hrs:   BP Temp Pulse Resp   11/22/14 0745 112/65 mmHg 98.5 ??F (36.9 ??C) 82 16   11/22/14 0440 96/60 mmHg 98.6 ??F (37 ??C) 74 16   11/22/14 0140 109/71 mmHg 98.6 ??F (37 ??C) 70 17     Temp (24hrs), Avg:97.8 ??F (36.6 ??C), Min:97.4 ??F (36.3 ??C), Max:98.6 ??F (37 ??C)      Vital signs stable, afebrile.    Exam:  Patient without distress.               Abdomen soft, fundus firm at level of umbilicus, non tender.                 Incision dry and clean without erythema.               Lower extremities are negative for swelling, cords or tenderness.    Lab/Data Review:  CMP: No results found for: NA, K, CL, CO2, AGAP, GLU, BUN, CREA, GFRAA, GFRNA, CA, MG, PHOS, ALB, TBIL, TP, ALB, GLOB, AGRAT, SGOT, ALT, GPT  CBC:   Lab Results   Component Value Date/Time    WBC 11.7* 11/22/2014 03:16 AM    HGB 9.8* 11/22/2014 03:16 AM    HCT 29.3* 11/22/2014 03:16 AM    PLT 219 11/22/2014 03:16 AM       Assessment and Plan:  Patient appears to be having uncomplicated post-cesarean course.  Continue routine post-op care and maternal education.

## 2014-11-23 MED ORDER — OXYCODONE-ACETAMINOPHEN 5 MG-325 MG TAB
5-325 mg | ORAL_TABLET | ORAL | Status: AC | PRN
Start: 2014-11-23 — End: ?

## 2014-11-23 MED ORDER — DOCUSATE SODIUM 100 MG CAP
100 mg | ORAL_CAPSULE | Freq: Two times a day (BID) | ORAL | Status: AC
Start: 2014-11-23 — End: 2015-02-21

## 2014-11-23 MED ORDER — IBUPROFEN 800 MG TAB
800 mg | ORAL_TABLET | Freq: Three times a day (TID) | ORAL | Status: AC
Start: 2014-11-23 — End: ?

## 2014-11-23 MED FILL — OXYCODONE-ACETAMINOPHEN 5 MG-325 MG TAB: 5-325 mg | ORAL | Qty: 1

## 2014-11-23 MED FILL — IBUPROFEN 400 MG TAB: 400 mg | ORAL | Qty: 2

## 2014-11-23 MED FILL — BOOSTRIX TDAP 2.5 LF UNIT-8 MCG-5 LF/0.5 ML INTRAMUSCULAR SYRINGE: INTRAMUSCULAR | Qty: 1

## 2014-11-23 NOTE — Other (Signed)
Mother D/C home, prescriptions and instructions received.

## 2014-11-23 NOTE — Other (Signed)
Bedside and Verbal shift change report given to N.Rice,RN (Cabin crewoncoming nurse) by a.Adam,RNC-MNN (offgoing nurse). Report included the following information SBAR, Procedure Summary, Intake/Output, MAR and Recent Results. ??   ??      ?? ??

## 2014-11-23 NOTE — Progress Notes (Signed)
Post-Operative Day Number 2 Progress Note    Patient doing well post-op day 2 from cesarean delivery without significant complaints.  Pain controlled on current medication.  Voiding without difficulty, normal lochia.    Vitals:  Patient Vitals for the past 8 hrs:   BP Temp Pulse Resp   11/23/14 0730 108/66 mmHg 96.8 ??F (36 ??C) 63 16     Temp (24hrs), Avg:98 ??F (36.7 ??C), Min:96.8 ??F (36 ??C), Max:98.6 ??F (37 ??C)      Vital signs stable, afebrile.    Exam:  Patient without distress.               Abdomen soft, fundus firm at level of umbilicus, non tender.                Incision dry and clean without erythema.               Lower extremities are negative for swelling, cords or tenderness.    Lab/Data Review:  All lab results for the last 24 hours reviewed.    Assessment and Plan:  Patient appears to be having uncomplicated post-cesarean course.  Continue routine post-op care and maternal education.    1.  Pt desires an early discharge; pt and baby are meeting postpartum goals will discharge home with office follow up.

## 2014-11-23 NOTE — Lactation Note (Signed)
This note was copied from the chart of Amanda Little.  24 hour progress note   am wt assessed at -7.1 %  13 baby led feedings  4 wet 2 stools.  Confident and independent mother, nurturing her baby. Anticipating a 2 day post op discharge today.  WL and AWP #'s provided. No questions or concerns at this time.

## 2014-11-23 NOTE — Discharge Summary (Signed)
Obstetrical Discharge Summary     Name: Amanda Little L Eber MRN: [id number]  SSN: [id number]    Date of Birth: 1991/06/12  Age: 24 y.o.  Sex: female      Admit Date: 11/21/2014    Discharge Date: 11/23/2014     Admitting Physician: Rennis Pettyaymonda B Kaylinn Dedic, MD     Attending Physician:  Rennis Pettyaymonda B Anaiza Behrens, MD     * Admission Diagnoses: Elect[facility] GBS negative (35 0/7); G7 P1  repeat  Pregnancy    * Discharge Diagnoses:   Information for the patient's newborn:  Yetta BarreJones, Girl [[id number]][[id number]]   Delivery of a 2.72 kg female infant via Low Transverse C-Section  on 11/21/2014 at 12:35 PM  by Rae Plotner B Jaamal Farooqui. Apgars were 9 and 9.       Additional Diagnoses:   Hospital Problems as of 11/23/2014  Date Reviewed: 11/21/2014          Codes Class Noted - Resolved POA    RESOLVED: Pregnancy ICD-10-CM: Z33.1  ICD-9-CM: V22.2  11/21/2014 - 11/21/2014 Yes             Lab Results   Component Value Date/Time    ABO/RH(D) A POSITIVE 11/21/2014 10:30 AM    RUBELLA, EXTERNAL Immune 03/24/2014    GRBSTREP, EXTERNAL neg 10/24/2014    ABO,RH A pos 03/24/2014      Immunization History   Administered Date(s) Administered   ??? Tdap 11/22/2014       * Procedures: c/s   Procedure(s):  CESAREAN SECTION           * Discharge Condition: good    [facility]* Hospital Course: Normal hospital course following the delivery.    * Disposition: Home    Discharge Medications:   Current Discharge Medication List      START taking these medications    Details   ibuprofen (MOTRIN) 800 mg tablet Take 1 Tab by mouth every eight (8) hours.  Qty: 30 Tab, Refills: 1      oxyCODONE-acetaminophen (PERCOCET) 5-325 mg per tablet Take 1 Tab by mouth every four (4) hours as needed. Max Daily Amount: 6 Tabs.  Qty: 45 Tab, Refills: 0      docusate sodium (COLACE) 100 mg capsule Take 1 Cap by mouth two (2) times a day for 90 days.  Qty: 60 Cap, Refills: 2         CONTINUE these medications which have NOT CHANGED    Details   PNV no.24-iron-folic acid-dha [id number] mg-mcg-mg cmpk Take  by mouth.              * Follow-up Care/Patient Instructions:  Activity: Activity as tolerated; no sex for 6 weeks; no heavy lifting for 6 weeks; no driving while on narcotics; no driving for 2 weeks  Diet: Regular Diet  Wound Care: Keep wound clean and dry    Follow-up Information     Follow up With Details Comments Contact Info    None   None ([phone]) Patient stated that they have no PCP      [facility] Schedule an appointment as soon as possible for a visit in 1 week 1 to 2 weeks postpartum            Signed By:  Rennis Pettyaymonda B Delberta Folts, MD     November 23, 2014

## 2014-11-24 MED FILL — ONDANSETRON (PF) 4 MG/2 ML INJECTION: 4 mg/2 mL | INTRAMUSCULAR | Qty: 2

## 2014-11-24 MED FILL — OXYTOCIN 20 UNITS/1000 ML IN LACTATED RINGERS IV: 20 unit/1,000 mL | INTRAVENOUS | Qty: 1000

## 2014-11-24 MED FILL — BUPIVACAINE (PF) 0.75 % (7.5 MG/ML) IJ SOLN: 0.75 % (7.5 mg/mL) | INTRAMUSCULAR | Qty: 30

## 2018-01-31 ENCOUNTER — Ambulatory Visit (INDEPENDENT_AMBULATORY_CARE_PROVIDER_SITE_OTHER): Payer: Self-pay | Admitting: Orthopedic Surgery

## 2018-09-04 ENCOUNTER — Encounter: Payer: Self-pay | Admitting: *Deleted

## 2018-09-05 ENCOUNTER — Telehealth: Payer: Self-pay | Admitting: Diagnostic Neuroimaging

## 2018-09-05 ENCOUNTER — Encounter: Payer: Self-pay | Admitting: Diagnostic Neuroimaging

## 2018-09-05 ENCOUNTER — Ambulatory Visit: Payer: Medicaid Other | Admitting: Diagnostic Neuroimaging

## 2018-09-05 VITALS — BP 115/70 | HR 91 | Ht 60.0 in | Wt 138.8 lb

## 2018-09-05 DIAGNOSIS — G43101 Migraine with aura, not intractable, with status migrainosus: Secondary | ICD-10-CM

## 2018-09-05 DIAGNOSIS — G4489 Other headache syndrome: Secondary | ICD-10-CM | POA: Diagnosis not present

## 2018-09-05 MED ORDER — AMITRIPTYLINE HCL 25 MG PO TABS: 25.0000 mg | ORAL_TABLET | Freq: Every day | ORAL | 6 refills | Status: DC

## 2018-09-05 MED ORDER — RIZATRIPTAN BENZOATE 10 MG PO TBDP: 10.0000 mg | ORAL_TABLET | ORAL | 6 refills | Status: DC | PRN

## 2018-09-05 NOTE — Telephone Encounter (Signed)
Medicaid order sent to [facility]. They obtain the auth and will reach out to the pt to schedule.  °

## 2018-09-05 NOTE — Telephone Encounter (Signed)
Patient is aware of this. She has their number of [phone] and to call them if she has not heard in the next 2-3 business days.

## 2018-09-05 NOTE — Patient Instructions (Signed)
NEW HEADACHE SYNDROME (migraine vs other secondary HA)  - check MRI brain w/wo  - start amitriptyline 25mg  at bedtime (migraine prevention)  - rizatriptan 10mg  as needed for breakthrough headache; may repeat x 1 after 2 hours; max 2 tabs per day or 8 per month (migraine rescue)  - To prevent or relieve headaches, try the following:  Cool Compress. Lie down and place a cool compress on your head.   Avoid headache triggers. If certain foods or odors seem to have triggered your migraines in the past, avoid them. A headache diary might help you identify triggers.   Include physical activity in your daily routine.   Manage stress. Find healthy ways to cope with the stressors, such as delegating tasks on your to-do list.   Practice relaxation techniques. Try deep breathing, yoga, massage and visualization.   Eat regularly. Eating regularly scheduled meals and maintaining a healthy diet might help prevent headaches. Also, drink plenty of fluids.   Follow a regular sleep schedule. Sleep deprivation might contribute to headaches  Consider biofeedback. With this mind-body technique, you learn to control certain bodily functions - such as muscle tension, heart rate and blood pressure - to prevent headaches or reduce headache pain.

## 2018-09-05 NOTE — Progress Notes (Signed)
[facility]  PATIENT: Alexandra Huerta DOB: Jul 15, 1991  REFERRING CLINICIAN: Hunter HISTORY FROM: patient  REASON FOR VISIT: new consult    HISTORICAL  CHIEF COMPLAINT:  Chief Complaint  Patient presents with  . Headache    rm 7, New Pt, "constant headaches from temples to forehead every day, Naproxen, Tylenol, Ibuprofen, Advil, Aleve, BC powders not helpful"    HISTORY OF PRESENT ILLNESS:   27 year old female here for evaluation of headaches.  Past 3 to 4 months patient has had daily, constant, pounding and throbbing headaches of a frontal and bitemporal regions, associated with blurred vision, seeing spots, photophobia and phonophobia.  No significant nausea or vomiting.  Patient having daily headaches, lasting all day.  She tried naproxen, Tylenol, ibuprofen, Aleve BC powder without relief.  Patient does remember similar types of headaches at age 27 years old lasting for a few months, but not officially diagnosed.  She did go to the emergency room one time had a CT scan which apparently was unremarkable.  About 5 months ago patient has started to have trouble falling asleep and staying asleep.  She is having 4 to 5 hours per night.  She denies any significant changes in stress, sleep, exercise, nutrition or caffeine.  No family history of headaches or migraine headaches.   REVIEW OF SYSTEMS: Full 14 system review of systems performed and negative with exception of: Headache blurred vision joint pain allergies not enough sleep decreased energy disinterest activities.  ALLERGIES: Allergies  Allergen Reactions  . Latex Itching    rash    HOME MEDICATIONS: Outpatient Medications Prior to Visit  Medication Sig Dispense Refill  . naproxen (NAPROSYN) 500 MG tablet TAKE 1 TABLET BY MOUTH EVERY TWELVE HOURS WITH FOOD FOR 10 DAYS  0   No facility-administered medications prior to visit.     PAST MEDICAL HISTORY: Past Medical History:  Diagnosis Date  .  Headache     PAST SURGICAL HISTORY: Past Surgical History:  Procedure Laterality Date  . CESAREAN SECTION  2010, 2016, 2017    FAMILY HISTORY: Family History  Problem Relation Age of Onset  . Lupus Mother     SOCIAL HISTORY: Social History   Socioeconomic History  . Marital status: Married    Spouse name: Nutritional therapist  . Number of children: 4  . Years of education: 12  . 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 Financial resource strain: Not on file  . Food insecurity:    Worry: Not on file    Inability: Not on file  . Transportation needs:    Medical: Not on file    Non-medical: Not on file  Tobacco Use  . Smoking status: Current Every Day Smoker    Packs/day: 0.50  . Smokeless tobacco: Never Used  . Tobacco comment: 09/05/18 5 cigs daily  Substance and Sexual Activity  . Alcohol use: Not Currently  . Drug use: Never  . Sexual activity: Not on file  Lifestyle  . Physical activity:    Days per week: Not on file    Minutes per session: Not on file  . Stress: Not on file  Relationships  . Social connections:    Talks on phone: Not on file    Gets together: Not on file    Attends religious service: Not on file    Active member of club or organization: Not on file    Attends meetings of  clubs or organizations: Not on file    Relationship status: Not on file  . Intimate partner violence:    Fear of current or ex partner: Not on file    Emotionally abused: Not on file    Physically abused: Not on file    Forced sexual activity: Not on file  Other Topics Concern  . Not on file  Social History Narrative   Lives with husband, 4 children   Caffeine - 1 cup coffee daily     PHYSICAL EXAM  GENERAL EXAM/CONSTITUTIONAL: Vitals:  Vitals:   09/05/18 0842  BP: 115/70  Pulse: 91  Weight: 138 lb 12.8 oz (63 kg)  Height: 5' (1.524 m)     Body mass index is 27.11 kg/m. Wt Readings from Last 3  Encounters:  09/05/18 138 lb 12.8 oz (63 kg)     Patient is in no distress; well developed, nourished and groomed; neck is supple  CARDIOVASCULAR:  Examination of carotid arteries is normal; no carotid bruits  Regular rate and rhythm, no murmurs  Examination of peripheral vascular system by observation and palpation is normal  EYES:  Ophthalmoscopic exam of optic discs and posterior segments is normal; no papilledema or hemorrhages  Visual Acuity Screening   Right eye Left eye Both eyes  Without correction: 20/40 20/30   With correction:        MUSCULOSKELETAL:  Gait, strength, tone, movements noted in Neurologic exam below  NEUROLOGIC: MENTAL STATUS:  No flowsheet data found.  awake, alert, oriented to person, place and time  recent and remote memory intact  normal attention and concentration  language fluent, comprehension intact, naming intact  fund of knowledge appropriate  CRANIAL NERVE:   2nd - no papilledema on fundoscopic exam  2nd, 3rd, 4th, 6th - pupils equal and reactive to light, visual fields full to confrontation, extraocular muscles intact, no nystagmus  5th - facial sensation symmetric  7th - facial strength symmetric  8th - hearing intact  9th - palate elevates symmetrically, uvula midline  11th - shoulder shrug symmetric  12th - tongue protrusion midline  MOTOR:   normal bulk and tone, full strength in the BUE, BLE  SENSORY:   normal and symmetric to light touch, temperature, vibration  COORDINATION:   finger-nose-finger, fine finger movements normal  REFLEXES:   deep tendon reflexes present and symmetric  GAIT/STATION:   narrow based gait; able to walk tandem; romberg is negative     DIAGNOSTIC DATA (LABS, IMAGING, TESTING) - I reviewed patient records, labs, notes, testing and imaging myself where available.  No results found for: WBC, HGB, HCT, MCV, PLT No results found for: NA, K, CL, CO2, GLUCOSE, BUN,  CREATININE, CALCIUM, PROT, ALBUMIN, AST, ALT, ALKPHOS, BILITOT, GFRNONAA, GFRAA No results found for: CHOL, HDL, LDLCALC, LDLDIRECT, TRIG, CHOLHDL No results found for: JWJX9J No results found for: VITAMINB12 No results found for: TSH      ASSESSMENT AND PLAN  27 y.o. year old female here with new onset headaches in the last 4 months associated with blurred vision, photophobia, phonophobia, in the setting of worsening insomnia.  Could represent migraine headaches with aura.  Due to severe and new onset nature of symptoms will check MRI of the brain to rule out other secondary causes headache.   Dx:  1. Other headache syndrome   2. Migraine with aura and with status migrainosus, not intractable     PLAN:  NEW HEADACHE SYNDROME (migraine vs other secondary HA) - check MRI  brain w/wo (new HA x 4 months; vision changes) - start amitriptyline 25mg  at bedtime - rizatriptan 10mg  as needed for breakthrough headache; may repeat x 1 after 2 hours; max 2 tabs per day or 8 per month - To prevent or relieve headaches, try the following:  Cool Compress. Lie down and place a cool compress on your head.   Avoid headache triggers. If certain foods or odors seem to have triggered your migraines in the past, avoid them. A headache diary might help you identify triggers.   Include physical activity in your daily routine.   Manage stress. Find healthy ways to cope with the stressors, such as delegating tasks on your to-do list.   Practice relaxation techniques. Try deep breathing, yoga, massage and visualization.   Eat regularly. Eating regularly scheduled meals and maintaining a healthy diet might help prevent headaches. Also, drink plenty of fluids.   Follow a regular sleep schedule. Sleep deprivation might contribute to headaches  Consider biofeedback. With this mind-body technique, you learn to control certain bodily functions - such as muscle tension, heart rate and blood pressure -  to prevent headaches or reduce headache pain.  Orders Placed This Encounter  Procedures  . MR BRAIN W WO CONTRAST   Meds ordered this encounter  Medications  . amitriptyline (ELAVIL) 25 MG tablet    Sig: Take 1 tablet (25 mg total) by mouth at bedtime.    Dispense:  30 tablet    Refill:  6  . rizatriptan (MAXALT-MLT) 10 MG disintegrating tablet    Sig: Take 1 tablet (10 mg total) by mouth as needed for migraine. May repeat in 2 hours if needed    Dispense:  9 tablet    Refill:  6   Return in about 4 months (around 01/05/2019) for with NP.    Suanne Marker, MD 09/05/2018, 8:56 AM Certified in Neurology, Neurophysiology and Neuroimaging  [facility] [address], [address] [place], [place] [address] [phone]

## 2018-09-16 ENCOUNTER — Ambulatory Visit
Admission: RE | Admit: 2018-09-16 | Discharge: 2018-09-16 | Disposition: A | Discharge status: Home / Self Care | Payer: Medicaid Other | Source: Ambulatory Visit | Attending: Diagnostic Neuroimaging | Admitting: Diagnostic Neuroimaging

## 2018-09-16 DIAGNOSIS — G4489 Other headache syndrome: Secondary | ICD-10-CM | POA: Diagnosis not present

## 2018-09-16 MED ORDER — GADOBENATE DIMEGLUMINE 529 MG/ML IV SOLN
12.0000 mL | Freq: Once | INTRAVENOUS | Status: AC | PRN
  Administered 2018-09-16: 12 mL via INTRAVENOUS

## 2018-09-18 ENCOUNTER — Telehealth: Payer: Self-pay | Admitting: *Deleted

## 2018-09-18 NOTE — Telephone Encounter (Signed)
-----   Message from Suanne MarkerVikram R Penumalli, MD sent at 09/18/2018 10:29 AM EST ----- Unremarkable imaging results. Please call patient. Continue current plan. -VRP

## 2018-09-18 NOTE — Telephone Encounter (Signed)
Spoke to pt and relayed that her MRI brain results per Dr. Marjory Lies were unremarkable.  She verbalized understanding.

## 2019-01-03 DIAGNOSIS — G43101 Migraine with aura, not intractable, with status migrainosus: Secondary | ICD-10-CM | POA: Insufficient documentation

## 2019-01-03 NOTE — Progress Notes (Deleted)
PATIENT: Alexandra Huerta DOB: 04-Apr-1991  REASON FOR VISIT: follow up HISTORY FROM: patient  No chief complaint on file.    HISTORY OF PRESENT ILLNESS: Today 01/03/19 Alexandra Huerta is a 28 y.o. female here today for follow up for migraines. She is taking amitriptyline 25mg  at night. She uses naproxen and  Maxalt for abortive therapy.    HISTORY: (copied from Dr Richrd Humbles note on 09/05/2018) 28 year old female here for evaluation of headaches.  Past 3 to 4 months patient has had daily, constant, pounding and throbbing headaches of a frontal and bitemporal regions, associated with blurred vision, seeing spots, photophobia and phonophobia.  No significant nausea or vomiting.  Patient having daily headaches, lasting all day.  She tried naproxen, Tylenol, ibuprofen, Aleve BC powder without relief.  Patient does remember similar types of headaches at age 5 years old lasting for a few months, but not officially diagnosed.  She did go to the emergency room one time had a CT scan which apparently was unremarkable.  About 5 months ago patient has started to have trouble falling asleep and staying asleep.  She is having 4 to 5 hours per night.  She denies any significant changes in stress, sleep, exercise, nutrition or caffeine.  No family history of headaches or migraine headaches.  REVIEW OF SYSTEMS: Out of a complete 14 system review of symptoms, the patient complains only of the following symptoms, and all other reviewed systems are negative.  ALLERGIES: Allergies  Allergen Reactions  . Latex Itching    rash    HOME MEDICATIONS: Outpatient Medications Prior to Visit  Medication Sig Dispense Refill  . amitriptyline (ELAVIL) 25 MG tablet Take 1 tablet (25 mg total) by mouth at bedtime. 30 tablet 6  . naproxen (NAPROSYN) 500 MG tablet TAKE 1 TABLET BY MOUTH EVERY TWELVE HOURS WITH FOOD FOR 10 DAYS  0  . rizatriptan (MAXALT-MLT) 10 MG disintegrating tablet Take 1 tablet (10 mg  total) by mouth as needed for migraine. May repeat in 2 hours if needed 9 tablet 6   No facility-administered medications prior to visit.     PAST MEDICAL HISTORY: Past Medical History:  Diagnosis Date  . Headache     PAST SURGICAL HISTORY: Past Surgical History:  Procedure Laterality Date  . CESAREAN SECTION  2010, 2016, 2017    FAMILY HISTORY: Family History  Problem Relation Age of Onset  . Lupus Mother     SOCIAL HISTORY: Social History   Socioeconomic History  . Marital status: Married    Spouse name: Nutritional therapist  . Number of children: 4  . Years of education: 71  . 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 Financial resource strain: Not on file  . Food insecurity:    Worry: Not on file    Inability: Not on file  . Transportation needs:    Medical: Not on file    Non-medical: Not on file  Tobacco Use  . Smoking status: Current Every Day Smoker    Packs/day: 0.50  . Smokeless tobacco: Never Used  . Tobacco comment: 09/05/18 5 cigs daily  Substance and Sexual Activity  . Alcohol use: Not Currently  . Drug use: Never  . Sexual activity: Not on file  Lifestyle  . Physical activity:    Days per week: Not on file    Minutes per session: Not on file  . Stress: Not on file  Relationships  .  Social connections:    Talks on phone: Not on file    Gets together: Not on file    Attends religious service: Not on file    Active member of club or organization: Not on file    Attends meetings of clubs or organizations: Not on file    Relationship status: Not on file  . Intimate partner violence:    Fear of current or ex partner: Not on file    Emotionally abused: Not on file    Physically abused: Not on file    Forced sexual activity: Not on file  Other Topics Concern  . Not on file  Social History Narrative   Lives with husband, 4 children   Caffeine - 1 cup coffee daily      PHYSICAL EXAM  There  were no vitals filed for this visit. There is no height or weight on file to calculate BMI.  Generalized: Well developed, in no acute distress  Cardiology: normal rate and rhythm, no murmur noted Neurological examination  Mentation: Alert oriented to time, place, history taking. Follows all commands speech and language fluent Cranial nerve II-XII: Pupils were equal round reactive to light. Extraocular movements were full, visual field were full on confrontational test. Facial sensation and strength were normal. Uvula tongue midline. Head turning and shoulder shrug  were normal and symmetric. Motor: The motor testing reveals 5 over 5 strength of all 4 extremities. Good symmetric motor tone is noted throughout.  Sensory: Sensory testing is intact to soft touch on all 4 extremities. No evidence of extinction is noted.  Coordination: Cerebellar testing reveals good finger-nose-finger and heel-to-shin bilaterally.  Gait and station: Gait is normal. Tandem gait is normal. Romberg is negative. No drift is seen.  Reflexes: Deep tendon reflexes are symmetric and normal bilaterally.   DIAGNOSTIC DATA (LABS, IMAGING, TESTING) - I reviewed patient records, labs, notes, testing and imaging myself where available.  No flowsheet data found.   No results found for: WBC, HGB, HCT, MCV, PLT No results found for: NA, K, CL, CO2, GLUCOSE, BUN, CREATININE, CALCIUM, PROT, ALBUMIN, AST, ALT, ALKPHOS, BILITOT, GFRNONAA, GFRAA No results found for: CHOL, HDL, LDLCALC, LDLDIRECT, TRIG, CHOLHDL No results found for: HKGO7P No results found for: VITAMINB12 No results found for: TSH     ASSESSMENT AND PLAN 28 y.o. year old female  has a past medical history of Headache. here with ***    ICD-10-CM   1. Migraine with aura and with status migrainosus, not intractable G43.101        No orders of the defined types were placed in this encounter.    No orders of the defined types were placed in this  encounter.     I spent 15 minutes with the patient. 50% of this time was spent counseling and educating patient on plan of care and medications.    Shawnie Dapper, FNP-C 01/03/2019, 4:12 PM [facility] [address], [address] [place], [place] [address] [phone]

## 2019-01-07 ENCOUNTER — Ambulatory Visit: Payer: Medicaid Other | Admitting: Nurse Practitioner

## 2019-01-07 ENCOUNTER — Telehealth: Payer: Self-pay

## 2019-01-07 ENCOUNTER — Ambulatory Visit: Payer: Medicaid Other | Admitting: Family Medicine

## 2019-01-07 NOTE — Telephone Encounter (Signed)
Patient was a no call/no show for their appointment today.   

## 2019-01-08 ENCOUNTER — Encounter: Payer: Self-pay | Admitting: Family Medicine

## 2019-03-30 ENCOUNTER — Emergency Department (HOSPITAL_COMMUNITY)
Admission: EM | Admit: 2019-03-30 | Discharge: 2019-03-30 | Disposition: A | Discharge status: Home / Self Care | Payer: Self-pay | Attending: Emergency Medicine | Admitting: Emergency Medicine

## 2019-03-30 ENCOUNTER — Emergency Department (HOSPITAL_COMMUNITY): Payer: Self-pay

## 2019-03-30 ENCOUNTER — Other Ambulatory Visit: Payer: Self-pay

## 2019-03-30 ENCOUNTER — Encounter (HOSPITAL_COMMUNITY): Payer: Self-pay | Admitting: Emergency Medicine

## 2019-03-30 DIAGNOSIS — Z91040 Latex allergy status: Secondary | ICD-10-CM | POA: Insufficient documentation

## 2019-03-30 DIAGNOSIS — E860 Dehydration: Secondary | ICD-10-CM | POA: Insufficient documentation

## 2019-03-30 DIAGNOSIS — F17210 Nicotine dependence, cigarettes, uncomplicated: Secondary | ICD-10-CM | POA: Insufficient documentation

## 2019-03-30 DIAGNOSIS — N12 Tubulo-interstitial nephritis, not specified as acute or chronic: Secondary | ICD-10-CM | POA: Insufficient documentation

## 2019-03-30 DIAGNOSIS — E876 Hypokalemia: Secondary | ICD-10-CM | POA: Insufficient documentation

## 2019-03-30 LAB — PREGNANCY, URINE: Preg Test, Ur: NEGATIVE

## 2019-03-30 LAB — URINALYSIS, ROUTINE W REFLEX MICROSCOPIC
Bilirubin Urine: NEGATIVE
Glucose, UA: NEGATIVE mg/dL
Hgb urine dipstick: NEGATIVE
Ketones, ur: 20 mg/dL — AB
Nitrite: NEGATIVE
Protein, ur: >=300 mg/dL — AB
Specific Gravity, Urine: 1.025 (ref 1.005–1.030)
WBC, UA: >50 WBC/hpf — ABNORMAL HIGH (ref 0–5)
pH: 6 (ref 5.0–8.0)

## 2019-03-30 LAB — COMPREHENSIVE METABOLIC PANEL
ALT: 21 U/L (ref 0–44)
AST: 26 U/L (ref 15–41)
Albumin: 3.8 g/dL (ref 3.5–5.0)
Alkaline Phosphatase: 93 U/L (ref 38–126)
Anion gap: 16 — ABNORMAL HIGH (ref 5–15)
BUN: 7 mg/dL (ref 6–20)
CO2: 21 mmol/L — ABNORMAL LOW (ref 22–32)
Calcium: 9.2 mg/dL (ref 8.9–10.3)
Chloride: 95 mmol/L — ABNORMAL LOW (ref 98–111)
Creatinine, Ser: 0.99 mg/dL (ref 0.44–1.00)
GFR calc Af Amer: >60 mL/min (ref >60)
GFR calc non Af Amer: >60 mL/min (ref >60)
Glucose, Bld: 118 mg/dL — ABNORMAL HIGH (ref 70–99)
Potassium: 3.1 mmol/L — ABNORMAL LOW (ref 3.5–5.1)
Sodium: 132 mmol/L — ABNORMAL LOW (ref 135–145)
Total Bilirubin: 1.1 mg/dL (ref 0.3–1.2)
Total Protein: 8.3 g/dL — ABNORMAL HIGH (ref 6.5–8.1)

## 2019-03-30 LAB — CBC WITH DIFFERENTIAL/PLATELET
Abs Immature Granulocytes: 0.05 10*3/uL (ref 0.00–0.07)
Basophils Absolute: 0 10*3/uL (ref 0.0–0.1)
Basophils Relative: 0 %
Eosinophils Absolute: 0 10*3/uL (ref 0.0–0.5)
Eosinophils Relative: 0 %
HCT: 40.6 % (ref 36.0–46.0)
Hemoglobin: 13.2 g/dL (ref 12.0–15.0)
Immature Granulocytes: 0 %
Lymphocytes Relative: 14 %
Lymphs Abs: 1.5 10*3/uL (ref 0.7–4.0)
MCH: 27.3 pg (ref 26.0–34.0)
MCHC: 32.5 g/dL (ref 30.0–36.0)
MCV: 83.9 fL (ref 80.0–100.0)
Monocytes Absolute: 1.2 10*3/uL — ABNORMAL HIGH (ref 0.1–1.0)
Monocytes Relative: 11 %
Neutro Abs: 8.5 10*3/uL — ABNORMAL HIGH (ref 1.7–7.7)
Neutrophils Relative %: 75 %
Platelets: 278 10*3/uL (ref 150–400)
RBC: 4.84 MIL/uL (ref 3.87–5.11)
RDW: 14.6 % (ref 11.5–15.5)
WBC: 11.3 10*3/uL — ABNORMAL HIGH (ref 4.0–10.5)
nRBC: 0 % (ref 0.0–0.2)

## 2019-03-30 LAB — I-STAT BETA HCG BLOOD, ED (MC, WL, AP ONLY): I-stat hCG, quantitative: 6.1 m[IU]/mL — ABNORMAL HIGH (ref <5)

## 2019-03-30 LAB — LACTIC ACID, PLASMA
Lactic Acid, Venous: 0.7 mmol/L (ref 0.5–1.9)
Lactic Acid, Venous: 0.6 mmol/L (ref 0.5–1.9)

## 2019-03-30 MED ORDER — IBUPROFEN 600 MG PO TABS: 600.0000 mg | ORAL_TABLET | Freq: Four times a day (QID) | ORAL | 0 refills | Status: DC | PRN

## 2019-03-30 MED ORDER — SODIUM CHLORIDE 0.9 % IV BOLUS
1000.0000 mL | Freq: Once | INTRAVENOUS | Status: AC
  Administered 2019-03-30: 1000 mL via INTRAVENOUS

## 2019-03-30 MED ORDER — SODIUM CHLORIDE 0.9 % IV SOLN
2.0000 g | Freq: Once | INTRAVENOUS | Status: AC
  Administered 2019-03-30: 2 g via INTRAVENOUS
  Filled 2019-03-30: qty 20

## 2019-03-30 MED ORDER — ONDANSETRON 4 MG PO TBDP: 4.0000 mg | ORAL_TABLET | Freq: Three times a day (TID) | ORAL | 0 refills | Status: DC | PRN

## 2019-03-30 MED ORDER — PROMETHAZINE HCL 25 MG/ML IJ SOLN
12.5000 mg | Freq: Once | INTRAMUSCULAR | Status: AC
  Administered 2019-03-30: 12.5 mg via INTRAVENOUS
  Filled 2019-03-30: qty 1

## 2019-03-30 MED ORDER — POTASSIUM CHLORIDE CRYS ER 20 MEQ PO TBCR
40.0000 meq | EXTENDED_RELEASE_TABLET | Freq: Once | ORAL | Status: AC
  Administered 2019-03-30: 40 meq via ORAL
  Filled 2019-03-30: qty 2

## 2019-03-30 MED ORDER — ACETAMINOPHEN 500 MG PO TABS
1000.0000 mg | ORAL_TABLET | Freq: Once | ORAL | Status: AC
  Administered 2019-03-30: 1000 mg via ORAL
  Filled 2019-03-30: qty 2

## 2019-03-30 MED ORDER — MORPHINE SULFATE (PF) 4 MG/ML IV SOLN
4.0000 mg | Freq: Once | INTRAVENOUS | Status: AC
  Administered 2019-03-30: 20:00:00 4 mg via INTRAVENOUS
  Filled 2019-03-30: qty 1

## 2019-03-30 MED ORDER — SODIUM CHLORIDE 0.9% FLUSH: 3.0000 mL | Freq: Once | INTRAVENOUS | Status: DC

## 2019-03-30 MED ORDER — CEPHALEXIN 500 MG PO CAPS: 500.0000 mg | ORAL_CAPSULE | Freq: Four times a day (QID) | ORAL | 0 refills | Status: AC

## 2019-03-30 NOTE — ED Notes (Signed)
Patient transported to Ultrasound 

## 2019-03-30 NOTE — ED Triage Notes (Addendum)
PT states she is here for eval of 4 days of headache with fever. Pt also thinks she might have a kidney infection. *urine sample obtained. Pt urine dark yellow.

## 2019-03-30 NOTE — ED Notes (Signed)
Pt given cup of water for PO challenge

## 2019-03-30 NOTE — ED Provider Notes (Addendum)
[facility]   CSN: [id number] Arrival date & time: 03/30/19  1826    History   Chief Complaint Chief Complaint  Patient presents with   Headache   Fever   Urinary Tract Infection    HPI Alexandra Huerta is a 28 y.o. female.     HPI   28 yo F with no significant PMHx here with headache, nausea, abdominal pain, fever. Pt reports that her sx started around 4 days ago as mild headache, left flank pain, and mild suprapubic pain.  Since then, she has had persistent headache, nausea, vomiting, left leg pain, and intermittent urinary frequency.  She denies any acute onset of colicky or severe pain, and states it began gradually.  No neck pain or neck stiffness.  No photophobia.  She has a history of headaches and states he feels similar to her usual headaches.  She said difficulty eating and drinking due to her nausea for last several days, as well as general fatigue, chills, and fever.  Denies any specific alleviating or aggravating factors.  She is been taking over-the-counter medications as needed, which to help with the fever.  No known sick contacts.  No cough.  Past Medical History:  Diagnosis Date   Headache     Patient Active Problem List   Diagnosis Date Noted   Migraine with aura and with status migrainosus, not intractable 01/03/2019    Past Surgical History:  Procedure Laterality Date   CESAREAN SECTION  2010, 2016, 2017     OB History   No obstetric history on file.      Home Medications    Prior to Admission medications   Medication Sig Start Date End Date Taking? Authorizing Provider  acetaminophen (TYLENOL) 500 MG tablet Take 1,000 mg by mouth every 6 (six) hours as needed for headache (pain).   Yes [provider]  amitriptyline (ELAVIL) 25 MG tablet Take 1 tablet (25 mg total) by mouth at bedtime. Patient not taking: Reported on 03/30/2019 09/05/18   Penumalli, Glenford BayleyVikram R, MD  cephALEXin  (KEFLEX) 500 MG capsule Take 1 capsule (500 mg total) by mouth 4 (four) times daily for 10 days. 03/30/19 04/09/19  Shaune PollackIsaacs, Shantera Monts, MD  ibuprofen (ADVIL) 600 MG tablet Take 1 tablet (600 mg total) by mouth every 6 (six) hours as needed for fever or moderate pain. 03/30/19   Shaune PollackIsaacs, Telma Pyeatt, MD  ondansetron (ZOFRAN ODT) 4 MG disintegrating tablet Take 1 tablet (4 mg total) by mouth every 8 (eight) hours as needed for nausea or vomiting. 03/30/19   Shaune PollackIsaacs, Lusine Corlett, MD  rizatriptan (MAXALT-MLT) 10 MG disintegrating tablet Take 1 tablet (10 mg total) by mouth as needed for migraine. May repeat in 2 hours if needed Patient not taking: Reported on 03/30/2019 09/05/18   Suanne MarkerPenumalli, Vikram R, MD    Family History Family History  Problem Relation Age of Onset   Lupus Mother     Social History Social History   Tobacco Use   Smoking status: Current Every Day Smoker    Packs/day: 0.50   Smokeless tobacco: Never Used   Tobacco comment: 09/05/18 5 cigs daily  Substance Use Topics   Alcohol use: Not Currently   Drug use: Never     Allergies   Latex   Review of Systems Review of Systems  Constitutional: Positive for chills, fatigue and fever.  HENT: Negative for congestion and rhinorrhea.   Eyes: Negative for visual disturbance.  Respiratory: Negative for cough, shortness  of breath and wheezing.   Cardiovascular: Negative for chest pain and leg swelling.  Gastrointestinal: Positive for abdominal pain, nausea and vomiting. Negative for diarrhea.  Genitourinary: Positive for dysuria and frequency. Negative for flank pain.  Musculoskeletal: Negative for neck pain and neck stiffness.  Skin: Negative for rash and wound.  Allergic/Immunologic: Negative for immunocompromised state.  Neurological: Positive for weakness. Negative for syncope and headaches.  All other systems reviewed and are negative.    Physical Exam Updated Vital Signs BP 107/80    Pulse 91    Temp 98.7 F (37.1 C) (Oral)     Resp (!) 23    LMP 02/28/2019 Comment: pt shielded   SpO2 100%   Physical Exam Vitals signs and nursing note reviewed.  Constitutional:      General: She is not in acute distress.    Appearance: She is well-developed.  HENT:     Head: Normocephalic and atraumatic.     Comments: Dry MM Eyes:     Conjunctiva/sclera: Conjunctivae normal.  Neck:     Musculoskeletal: Neck supple.  Cardiovascular:     Rate and Rhythm: Regular rhythm. Tachycardia present.     Heart sounds: Normal heart sounds. No murmur. No friction rub.  Pulmonary:     Effort: Pulmonary effort is normal. No respiratory distress.     Breath sounds: Normal breath sounds. No wheezing or rales.  Abdominal:     General: There is no distension.     Palpations: Abdomen is soft.     Tenderness: There is abdominal tenderness in the suprapubic area. There is left CVA tenderness.  Skin:    General: Skin is warm.     Capillary Refill: Capillary refill takes less than 2 seconds.  Neurological:     Mental Status: She is alert and oriented to person, place, and time.     Motor: No abnormal muscle tone.      ED Treatments / Results  Labs (all labs ordered are listed, but only abnormal results are displayed) Labs Reviewed  COMPREHENSIVE METABOLIC PANEL - Abnormal; Notable for the following components:      Result Value   Sodium 132 (*)    Potassium 3.1 (*)    Chloride 95 (*)    CO2 21 (*)    Glucose, Bld 118 (*)    Total Protein 8.3 (*)    Anion gap 16 (*)    All other components within normal limits  CBC WITH DIFFERENTIAL/PLATELET - Abnormal; Notable for the following components:   WBC 11.3 (*)    Neutro Abs 8.5 (*)    Monocytes Absolute 1.2 (*)    All other components within normal limits  URINALYSIS, ROUTINE W REFLEX MICROSCOPIC - Abnormal; Notable for the following components:   Color, Urine AMBER (*)    APPearance CLOUDY (*)    Ketones, ur 20 (*)    Protein, ur >=300 (*)    Leukocytes,Ua SMALL (*)    WBC,  UA >50 (*)    Bacteria, UA RARE (*)    All other components within normal limits  I-STAT BETA HCG BLOOD, ED ([facility], [facility], [facility] ONLY) - Abnormal; Notable for the following components:   I-stat hCG, quantitative 6.1 (*)    All other components within normal limits  URINE CULTURE  CULTURE, BLOOD (ROUTINE X 2)  CULTURE, BLOOD (ROUTINE X 2)  LACTIC ACID, PLASMA  LACTIC ACID, PLASMA  PREGNANCY, URINE    EKG None  Radiology Dg Chest 2 View  Result  Date: 03/30/2019 CLINICAL DATA:  Fever EXAM: CHEST - 2 VIEW COMPARISON:  None. FINDINGS: The heart size and mediastinal contours are within normal limits. Both lungs are clear. The visualized skeletal structures are unremarkable. IMPRESSION: No active cardiopulmonary disease. Electronically Signed   By: Katherine Mantle M.D.   On: 03/30/2019 20:31   US Renal  Result Date: 03/30/2019 CLINICAL DATA:  Left flank pain EXAM: RENAL / URINARY TRACT ULTRASOUND COMPLETE COMPARISON:  None. FINDINGS: Right Kidney: Renal measurements: 10.8 x 3.9 x 4.6 cm = volume: 100 mL . Echogenicity within normal limits. No mass or hydronephrosis visualized. Left Kidney: Renal measurements: 10.1 x 5 x 4.8 cm = volume: 126 mL. Echogenicity within normal limits. No mass or hydronephrosis visualized. Bladder: Appears normal for degree of bladder distention. IMPRESSION: Normal renal ultrasound. Electronically Signed   By: Elige Ko   On: 03/30/2019 21:32    Procedures Procedures (including critical care time)  Medications Ordered in ED Medications  sodium chloride flush (NS) 0.9 % injection 3 mL (has no administration in time range)  cefTRIAXone (ROCEPHIN) 2 g in sodium chloride 0.9 % 100 mL IVPB (0 g Intravenous Stopped 03/30/19 2052)  sodium chloride 0.9 % bolus 1,000 mL (0 mLs Intravenous Stopped 03/30/19 2137)  sodium chloride 0.9 % bolus 1,000 mL (0 mLs Intravenous Stopped 03/30/19 2246)  acetaminophen (TYLENOL) tablet 1,000 mg (1,000 mg Oral Given 03/30/19 2001)  morphine  4 MG/ML injection 4 mg (4 mg Intravenous Given 03/30/19 2001)  promethazine (PHENERGAN) injection 12.5 mg (12.5 mg Intravenous Given 03/30/19 2002)  potassium chloride SA (K-DUR) CR tablet 40 mEq (40 mEq Oral Given 03/30/19 2247)     Initial Impression / Assessment and Plan / ED Course  I have reviewed the triage vital signs and the nursing notes.  Pertinent labs & imaging results that were available during my care of the patient were reviewed by me and considered in my medical decision making (see chart for details).        28 year old female here with suprapubic and left flank pain.  She is not sexually active and denies any vaginal symptoms.  Suspect acute, left-sided pyelonephritis.  Ultrasound shows no evidence of stone or retention.  No evidence of abscess.  Her abdomen is otherwise soft without focal findings to suggest peritonitis, appendicitis, diverticulitis, or other intra-abdominal pathology.  Lab work is consistent with dehydration and UTI.  She has a moderate leukocytosis. Dry mucous membranes which is consistent with acute infection.  Lactic acid normal x2 and I do not suspect occult sepsis.  I-STAT hCG 6 but urine pregnancy negative and she is not sexually active, suspect this was erroneous.  Patient feels markedly improved after fluids and antibiotics.  Will discharge on antibiotics, antiemetics, give good return precautions.  Encourage fluids at home.  Final Clinical Impressions(s) / ED Diagnoses   Final diagnoses:  Pyelonephritis  Dehydration  Hypokalemia    ED Discharge Orders         Ordered    cephALEXin (KEFLEX) 500 MG capsule  4 times daily     03/30/19 2243    ondansetron (ZOFRAN ODT) 4 MG disintegrating tablet  Every 8 hours PRN     03/30/19 2243    ibuprofen (ADVIL) 600 MG tablet  Every 6 hours PRN     03/30/19 2243           Shaune Pollack, MD 03/30/19 2242    Shaune Pollack, MD 03/30/19 2329

## 2019-03-30 NOTE — ED Notes (Signed)
Patient transported to X-ray 

## 2019-03-30 NOTE — Discharge Instructions (Addendum)
Your workup today suggests that you have a urinary tract infection (UTI) which has spread to your kidneys.  Please take your antibiotic as prescribed and over-the-counter pain medication (Tylenol or Motrin) as needed, but no more than recommended on the label instructions.  Drink PLENTY of fluids.  Call your regular doctor to schedule the next available appointment to follow up on today's ED visit, or return immediately to the ED if your pain worsens, you have decreased urine production, develop fever, persistent vomiting, or other symptoms that concern you.  

## 2019-04-02 LAB — URINE CULTURE
Culture: 80000 — AB
Special Requests: NORMAL

## 2019-04-03 ENCOUNTER — Telehealth: Payer: Self-pay

## 2019-04-03 NOTE — Telephone Encounter (Signed)
Post ED Visit - Positive Culture Follow-up  Culture report reviewed by antimicrobial stewardship pharmacist: [facility] []  Enzo Bi, Pharm.D. []  Celedonio Miyamoto, [address].D., BCPS AQ-ID []  Garvin Fila, Pharm.D., BCPS []  Georgina Pillion, Pharm.D., BCPS []  [place], [address].D., BCPS, AAHIVP []  Estella Husk, Pharm.D., BCPS, AAHIVP [x]  Lysle Pearl, PharmD, BCPS []  Phillips Climes, PharmD, BCPS []  Agapito Games, PharmD, BCPS []  Verlan Friends, PharmD []  Mervyn Gay, PharmD, BCPS []  Vinnie Level, PharmD  [facility] []  Len Childs, PharmD []  Greer Pickerel, PharmD []  Adalberto Cole, PharmD []  Perlie Gold, Rph []  Lonell Face) Jean Rosenthal, PharmD []  Earl Many, PharmD []  Junita Push, PharmD []  Dorna Leitz, PharmD []  Terrilee Files, PharmD []  Lynann Beaver, PharmD []  Keturah Barre, PharmD []  Loralee Pacas, PharmD []  Bernadene Person, PharmD   Positive urine culture Treated with Cephalexin, organism sensitive to the same and no further patient follow-up is required at this time.  Jerry Caras 04/03/2019, 8:45 AM

## 2019-04-04 LAB — CULTURE, BLOOD (ROUTINE X 2)
Culture: NO GROWTH
Culture: NO GROWTH
Special Requests: ADEQUATE

## 2021-08-25 ENCOUNTER — Ambulatory Visit: Payer: Medicaid Other | Attending: Family Medicine

## 2021-08-25 ENCOUNTER — Other Ambulatory Visit: Payer: Self-pay

## 2021-08-25 DIAGNOSIS — G8929 Other chronic pain: Secondary | ICD-10-CM | POA: Insufficient documentation

## 2021-08-25 DIAGNOSIS — R293 Abnormal posture: Secondary | ICD-10-CM | POA: Diagnosis present

## 2021-08-25 DIAGNOSIS — M545 Low back pain, unspecified: Secondary | ICD-10-CM | POA: Diagnosis not present

## 2021-08-25 DIAGNOSIS — M6281 Muscle weakness (generalized): Secondary | ICD-10-CM | POA: Insufficient documentation

## 2021-08-25 NOTE — Therapy (Signed)
[facility] [address] [place], [place], [address] Phone: [phone]   Fax:  [phone]  Physical Therapy Evaluation  Patient Details  Name: Alexandra Huerta MRN: [id number] Date of Birth: 1991-04-15 Referring Provider (PT): Verlon Au, MD   Encounter Date: 08/25/2021   PT End of Session - 08/25/21 1050     Visit Number 1    Number of Visits 9    Date for PT Re-Evaluation 10/20/21    Authorization Type [employer]- requesting auth    PT Start Time 1052    PT Stop Time 1125    PT Time Calculation (min) 33 min    Activity Tolerance Patient tolerated treatment well    Behavior During Therapy [facility] for tasks assessed/performed             Past Medical History:  Diagnosis Date   Headache     Past Surgical History:  Procedure Laterality Date   CESAREAN SECTION  2010, 2016, 2017    There were no vitals filed for this visit.    Subjective Assessment - 08/25/21 1055     Subjective Patient reports her back has been bothering her ever since having her first child. She feels after having the epidural it has been this way. She had her first child in 2010 by C-section. She has had 3 total C-sections over 7 years. She did not experience back pain while pregant, but began after the birth of her first child once she was home from the hospital. She can't lay flat on her back or sit up wiht erect posture due to the pain. She reports shooting pain that can go down either leg with prolonged walking down the side of the leg that stops at the knee. She denies any numbness/tingling. No red flag symptoms.    How long can you sit comfortably? about 5 minutes    How long can you stand comfortably? 2 hours    How long can you walk comfortably? 30 minutes    Diagnostic tests n/a    Patient Stated Goals "Hopefully my back will stop hurting"    Currently in Pain? Yes    Pain Score 5     Pain Location Back    Pain Orientation  Lower    Pain Descriptors / Indicators Sharp    Pain Type Chronic pain    Pain Radiating Towards occasional shooting pain to lateral aspect of BLE    Pain Onset More than a month ago    Pain Frequency Constant    Aggravating Factors  laying on back, erect posture    Pain Relieving Factors laying on the Rt side                [facility] PT Assessment - 08/25/21 0001       Assessment   Medical Diagnosis M54.50 (ICD-10-CM) - Low back pain, unspecified    Referring Provider (PT) Verlon Au, MD    Onset Date/Surgical Date --   2010   Hand Dominance Right    Next MD Visit nothing scheduled    Prior Therapy no      Precautions   Precautions None      Restrictions   Weight Bearing Restrictions No      Balance Screen   Has the patient fallen in the past 6 months No      Home Environment   Living Environment Private residence    Living Arrangements Spouse/significant other;Children  Type of Home Apartment    Additional Comments 13 stairs, increased pain with stair ascent      Prior Function   Level of Independence Independent    Vocation Unemployed    Leisure play games, cooking, baking      Cognition   Overall Cognitive Status Within Functional Limits for tasks assessed      Observation/Other Assessments   Other Surveys  Modified Oswestry    Modified Oswestry 36% disability      Sensation   Light Touch Not tested      Coordination   Gross Motor Movements are Fluid and Coordinated Yes      Functional Tests   Functional tests Other      Other:   Other/ Comments Plank: 5 seconds dull pain, excessive lordosis      Posture/Postural Control   Posture/Postural Control Postural limitations    Postural Limitations Increased lumbar lordosis      AROM   Lumbar Flexion WNL, increased back pain    Lumbar Extension WNL, increased back pain    Lumbar - Right Side Bend WNL    Lumbar - Left Side Bend WNL    Lumbar - Right Rotation WNL    Lumbar - Left Rotation  WNL      Strength   Right Hip Flexion 5/5    Right Hip Extension 4/5    Right Hip ABduction 4+/5    Left Hip Flexion 4+/5    Left Hip Extension 4/5    Left Hip ABduction 4+/5      Flexibility   Soft Tissue Assessment /Muscle Length yes    Hamstrings midl tightness bilaterally    Quadriceps WNL bilaterally      Palpation   Spinal mobility L-spine hypermobility with pain L4-S1 CPAs    Palpation comment TTP bilateral lumbar paraspinals, symmetrical pelvic alignment in standing, supine and prone      Special Tests   Other special tests (+) SLR                        Objective measurements completed on examination: See above findings.       [facility] Adult PT Treatment/Exercise - 08/25/21 0001       Self-Care   Self-Care Other Self-Care Comments    Other Self-Care Comments  see patient education                     PT Education - 08/25/21 1413     Education Details Education on current condition, POC, and HEP.    Person(s) Educated Patient    Methods Explanation;Demonstration;Verbal cues;Tactile cues;Handout    Comprehension Verbalized understanding;Returned demonstration;Verbal cues required;Tactile cues required              PT Short Term Goals - 08/25/21 1323       PT SHORT TERM GOAL #1   Title Patient will be independent with initial HEP.    Baseline issued at eval    Status New    Target Date 09/15/21      PT SHORT TERM GOAL #2   Title Patient will tolerate at least 15 minutes of seated activity.    Baseline 5 minutes    Status New    Target Date 09/22/21               PT Long Term Goals - 08/25/21 1408       PT LONG TERM GOAL #1   Title  Patient will be able to maintain plank with proper form without increased back pain for at least 20 seconds indicative of improved lumbopelvic stability.    Baseline 5 seconds    Status New    Target Date 10/20/21      PT LONG TERM GOAL #2   Title Patient will demonstrate 5/5  bilateral hip strength to improve stability about the chain with prolonged standing and walking activity.    Baseline see flowsheet    Status New    Target Date 10/20/21      PT LONG TERM GOAL #3   Title Patient will score less than 25% disability on the Oswestry Low Back Disability Index to signify clinically meaningful improvement in functional abilities.    Baseline see flowsheet    Status New    Target Date 10/20/21      PT LONG TERM GOAL #4   Title Patient will be independent with advanced home program to assist in management of her chronic condition.    Status New    Target Date 10/20/21                    Plan - 08/25/21 1134     Clinical Impression Statement Patient is a 30 y/o female who presents to OPPT with chief complaint of chronic low back pain that has been present ever since the birth of her first child in 2010 via C-section. She reports the pain is worsened with sitting and laying on her back and reports occasional shooting pain along lateral aspect of BLE with prolonged walking. Upon assessment she has full lumbar AROM in all planes reporting pain with flexion and extension with extension being the most pain provoking. She has mild hip weakness. She is noted to have pain and hypermobility about the L-spine and lacks core stability as she is able to maintain plank for brief duration before exhibiting excessive lumbar lordosis. She will benefit from skilled PT to assist in improving her lumbopelvic stability to assist in overall pain reduction in order to optimize her function.    Personal Factors and Comorbidities Time since onset of injury/illness/exacerbation;Fitness    Examination-Activity Limitations Sit;Stand;Locomotion Level    Examination-Participation Restrictions Meal Prep    Stability/Clinical Decision Making Stable/Uncomplicated    Clinical Decision Making Low    Rehab Potential Good    PT Frequency 1x / week    PT Duration 8 weeks    PT  Treatment/Interventions ADLs/Self Care Home Management;Cryotherapy;Electrical Stimulation;Moist Heat;Therapeutic activities;Therapeutic exercise;Balance training;Neuromuscular re-education;Patient/family education;Manual techniques;Dry needling;Taping    PT Next Visit Plan core stabilization    PT Home Exercise Plan Access Code: [email or web address]    Consulted and Agree with Plan of Care Patient             Patient will benefit from skilled therapeutic intervention in order to improve the following deficits and impairments:  Difficulty walking, Hypermobility, Pain, Improper body mechanics, Impaired flexibility, Decreased strength, Postural dysfunction  Visit Diagnosis: Chronic midline low back pain, unspecified whether sciatica present  Muscle weakness (generalized)  Abnormal posture     Problem List Patient Active Problem List   Diagnosis Date Noted   Migraine with aura and with status migrainosus, not intractable 01/03/2019   Letitia Libra, PT, DPT, ATC 08/25/21 2:18 PM  Check all possible CPT codes: 26948- Therapeutic Exercise, 97112- Neuro Re-education, 97140 - Manual Therapy, 97530 - Therapeutic Activities, 97535 - Self Care, and 97014 - Electrical stimulation (unattended)  [facility] [address] [place], [place], [address] Phone: [phone]   Fax:  [phone]  Name: Gwendolyne Welford MRN: [id number] Date of Birth: [birth year]

## 2021-08-26 ENCOUNTER — Ambulatory Visit: Payer: Medicaid Other | Admitting: Podiatry

## 2021-08-26 ENCOUNTER — Encounter: Payer: Self-pay | Admitting: Podiatry

## 2021-08-26 ENCOUNTER — Ambulatory Visit (INDEPENDENT_AMBULATORY_CARE_PROVIDER_SITE_OTHER): Payer: Medicaid Other

## 2021-08-26 DIAGNOSIS — M2011 Hallux valgus (acquired), right foot: Secondary | ICD-10-CM | POA: Diagnosis not present

## 2021-08-26 DIAGNOSIS — M21612 Bunion of left foot: Secondary | ICD-10-CM | POA: Diagnosis not present

## 2021-08-26 DIAGNOSIS — M79671 Pain in right foot: Secondary | ICD-10-CM | POA: Diagnosis not present

## 2021-08-26 DIAGNOSIS — M2012 Hallux valgus (acquired), left foot: Secondary | ICD-10-CM

## 2021-08-26 DIAGNOSIS — M21611 Bunion of right foot: Secondary | ICD-10-CM

## 2021-08-26 DIAGNOSIS — M79672 Pain in left foot: Secondary | ICD-10-CM

## 2021-08-26 NOTE — Progress Notes (Addendum)
  Subjective:  Patient ID: Alexandra Huerta, female    DOB: January 31, 1991,  MRN: [id number]  Chief Complaint  Patient presents with   Bunions      bilateral foot bunions    30 y.o. female presents with the above complaint. History confirmed with patient.  She has had large bunions for many years, previously did not want surgery but the bunions are becoming more painful.  They are painful in shoe gear and hurt on the bumps.  No pain when barefoot or in open toed shoes.  She has been trying anti-inflammatories which have not been helpful.  Has to wear closed toed shoes for her job  Objective:  Physical Exam: warm, good capillary refill, no trophic changes or ulcerative lesions, normal DP and PT pulses, and normal sensory exam.  Significant hallux valgus with hypermobility at the first ray bilaterally, pain on the medial eminence   Radiographs: Multiple views x-ray of both feet: Moderate to severe hallux valgus with frontal plane rotation of the first ray on the sesamoid axial view Assessment:   1. Bilateral bunions   2. Hallux valgus with bunions, left   3. Hallux valgus with bunions, right   4. Pain in both feet      Plan:  Patient was evaluated and treated and all questions answered.  Discussed the etiology and treatment including surgical and non surgical treatment for painful bunions.  She has exhausted all non surgical treatment prior to this visit including shoe gear changes and padding.  She desires surgical intervention. We discussed all risks including but not limited to: pain, swelling, infection, scar, numbness which may be temporary or permanent, chronic pain, stiffness, nerve pain or damage, wound healing problems, bone healing problems including delayed or non-union and recurrence. Specifically we discussed the following procedures: Lapidus bunionectomy with possible Akin osteotomy and autograft from calcaneus starting with the right foot. Informed consent was signed today. Surgery  will be scheduled at a mutually agreeable date. Information regarding this will be forwarded to our surgery scheduler.  In the interim prior to surgery on both feet I advised her to use as wider shoes as possible, bunion pad spacers and utilize ice and anti-inflammatories as needed for the pain such as Motrin or Aleve OTC.   Surgical plan:  Procedure: -Lapidus bunionectomy possible Akin and calcaneal autograft right foot  [facility]: [facility]  Anesthesia plan: -IV sedation with regional block  Postoperative pain plan: - Tylenol 1000 mg every 6 hours, ibuprofen 600 mg every 6 hours, gabapentin 300 mg every 8 hours x5 days, oxycodone 5 mg 1-2 tabs every 6 hours only as needed  DVT prophylaxis: -None required  WB Restrictions / DME needs: -NWB in CAM boot will dispense at surgical center   No follow-ups on file.

## 2021-09-02 ENCOUNTER — Telehealth: Payer: Self-pay | Admitting: Urology

## 2021-09-02 ENCOUNTER — Other Ambulatory Visit: Payer: Self-pay

## 2021-09-02 ENCOUNTER — Encounter: Payer: Self-pay | Admitting: Physical Therapy

## 2021-09-02 ENCOUNTER — Ambulatory Visit: Payer: Medicaid Other | Admitting: Physical Therapy

## 2021-09-02 DIAGNOSIS — M545 Low back pain, unspecified: Secondary | ICD-10-CM

## 2021-09-02 DIAGNOSIS — R293 Abnormal posture: Secondary | ICD-10-CM

## 2021-09-02 DIAGNOSIS — M6281 Muscle weakness (generalized): Secondary | ICD-10-CM

## 2021-09-02 DIAGNOSIS — G8929 Other chronic pain: Secondary | ICD-10-CM

## 2021-09-02 NOTE — Therapy (Signed)
[facility] [address] [place], [place], [address] Phone: [phone]   Fax:  [phone]  Physical Therapy Treatment  Patient Details  Name: Alexandra Huerta MRN: [id number] Date of Birth: [birth year] Referring Provider (PT): Verlon Au, MD   Encounter Date: 09/02/2021   PT End of Session - 09/02/21 0830     Visit Number 2    Number of Visits 9    Date for PT Re-Evaluation 10/20/21    Authorization Type [employer]- requesting auth    PT Start Time 0830    PT Stop Time 0915    PT Time Calculation (min) 45 min    Activity Tolerance Patient tolerated treatment well    Behavior During Therapy [facility] for tasks assessed/performed             Past Medical History:  Diagnosis Date   Headache     Past Surgical History:  Procedure Laterality Date   CESAREAN SECTION  2010, 2016, 2017    There were no vitals filed for this visit.   Subjective Assessment - 09/02/21 0833     Subjective Pt reports that she continues to have LBP.  She reports HEP non-compliance d/t staying busy with her children.  She reports 5/10 LBP.    How long can you sit comfortably? about 5 minutes    How long can you stand comfortably? 2 hours    How long can you walk comfortably? 30 minutes    Diagnostic tests n/a    Patient Stated Goals "Hopefully my back will stop hurting"    Pain Onset More than a month ago               [facility] Adult PT Treatment/Exercise:  Therapeutic Exercise: - nu-step L5 35m while taking subjective and planning session with patient - Manual hip flexor stretch - (modified thomas with RF) - 1' ea (R tighter than L) - Alternating clamshell - GTB - 2x20 - PPT 5'' x10 - hip adduction ball squeeze - 5'' hold - 2x10 - LTR - 20x - Alternating SLR from foam roller - 2x10 - Bridge - 5'' 2x10 - abdominal swiss ball squeeze - 10x 5'' hold    PT Short Term Goals - 08/25/21 1323       PT SHORT TERM GOAL #1    Title Patient will be independent with initial HEP.    Baseline issued at eval    Status New    Target Date 09/15/21      PT SHORT TERM GOAL #2   Title Patient will tolerate at least 15 minutes of seated activity.    Baseline 5 minutes    Status New    Target Date 09/22/21               PT Long Term Goals - 08/25/21 1408       PT LONG TERM GOAL #1   Title Patient will be able to maintain plank with proper form without increased back pain for at least 20 seconds indicative of improved lumbopelvic stability.    Baseline 5 seconds    Status New    Target Date 10/20/21      PT LONG TERM GOAL #2   Title Patient will demonstrate 5/5 bilateral hip strength to improve stability about the chain with prolonged standing and walking activity.    Baseline see flowsheet    Status New    Target Date 10/20/21      PT LONG  TERM GOAL #3   Title Patient will score less than 25% disability on the Oswestry Low Back Disability Index to signify clinically meaningful improvement in functional abilities.    Baseline see flowsheet    Status New    Target Date 10/20/21      PT LONG TERM GOAL #4   Title Patient will be independent with advanced home program to assist in management of her chronic condition.    Status New    Target Date 10/20/21                   Plan - 09/02/21 0853     Clinical Impression Statement Pt reports no increase in baseline pain following therapy  HEP was reviewed, but left unchanged    Overall, Alexandra Huerta is progressing well with therapy.  Today we concentrated on core strengthening and hip strengthening.  Pt shows high level of fatigue with mat exercises and will benefit from continued work on core strength/endurance.  Cued for form throughout.  Pt will continue to benefit from skilled physical therapy to address remaining deficits and achieve listed goals.  Continue per POC.    Personal Factors and Comorbidities Time since onset of  injury/illness/exacerbation;Fitness    Examination-Activity Limitations Sit;Stand;Locomotion Level    Examination-Participation Restrictions Meal Prep    Stability/Clinical Decision Making Stable/Uncomplicated    Rehab Potential Good    PT Frequency 1x / week    PT Duration 8 weeks    PT Treatment/Interventions ADLs/Self Care Home Management;Cryotherapy;Electrical Stimulation;Moist Heat;Therapeutic activities;Therapeutic exercise;Balance training;Neuromuscular re-education;Patient/family education;Manual techniques;Dry needling;Taping    PT Next Visit Plan core stabilization    PT Home Exercise Plan Access Code: [email or web address]    Consulted and Agree with Plan of Care Patient             Patient will benefit from skilled therapeutic intervention in order to improve the following deficits and impairments:  Difficulty walking, Hypermobility, Pain, Improper body mechanics, Impaired flexibility, Decreased strength, Postural dysfunction  Visit Diagnosis: Chronic midline low back pain, unspecified whether sciatica present  Muscle weakness (generalized)  Abnormal posture     Problem List Patient Active Problem List   Diagnosis Date Noted   Migraine with aura and with status migrainosus, not intractable 01/03/2019    Fredderick Phenix, PT 09/02/2021, 9:06 AM  [facility] [address] [place], [place], [address] Phone: [phone]   Fax:  [phone]  Name: Alexandra Huerta MRN: [id number] Date of Birth: 1991/03/31

## 2021-09-02 NOTE — Telephone Encounter (Signed)
DOS - 09/17/21  Alexandra Huerta OSTEOTOMY --- 70761 LAPIDUS PROCEDURE INCLUDING BUNIONECTOMY RIGHT --- 51834 BONE GRAFT RIGHT --- 20900   [employer] EFFECTIVE DATE  - 05/07/21  RECEIVED FAX FROM [employer] [place] STATING THAT CPT CODES 37357, [phone], 20900 HAS BEEN APPROVED, AUTH # [id number], GOOD FROM 09/17/2021 - 11/16/2021  REF # I - [id number] WHEN I SPOKE WITH TONI ON THE PHONE TO START AUTH.

## 2021-09-09 ENCOUNTER — Ambulatory Visit
Admission: EM | Admit: 2021-09-09 | Discharge: 2021-09-09 | Disposition: A | Discharge status: Home / Self Care | Payer: Medicaid Other

## 2021-09-09 ENCOUNTER — Emergency Department (HOSPITAL_COMMUNITY)
Admission: EM | Admit: 2021-09-09 | Discharge: 2021-09-09 | Disposition: A | Discharge status: Home / Self Care | Payer: Medicaid Other | Attending: Emergency Medicine | Admitting: Emergency Medicine

## 2021-09-09 ENCOUNTER — Emergency Department (HOSPITAL_COMMUNITY): Payer: Medicaid Other

## 2021-09-09 ENCOUNTER — Other Ambulatory Visit: Payer: Self-pay

## 2021-09-09 ENCOUNTER — Encounter (HOSPITAL_COMMUNITY): Payer: Self-pay | Admitting: *Deleted

## 2021-09-09 DIAGNOSIS — R072 Precordial pain: Secondary | ICD-10-CM | POA: Insufficient documentation

## 2021-09-09 DIAGNOSIS — E871 Hypo-osmolality and hyponatremia: Secondary | ICD-10-CM | POA: Diagnosis not present

## 2021-09-09 DIAGNOSIS — E876 Hypokalemia: Secondary | ICD-10-CM | POA: Diagnosis not present

## 2021-09-09 DIAGNOSIS — R079 Chest pain, unspecified: Secondary | ICD-10-CM

## 2021-09-09 DIAGNOSIS — Z91040 Latex allergy status: Secondary | ICD-10-CM | POA: Insufficient documentation

## 2021-09-09 DIAGNOSIS — F17210 Nicotine dependence, cigarettes, uncomplicated: Secondary | ICD-10-CM | POA: Diagnosis not present

## 2021-09-09 DIAGNOSIS — D649 Anemia, unspecified: Secondary | ICD-10-CM | POA: Diagnosis not present

## 2021-09-09 LAB — CBC
HCT: 35.3 % — ABNORMAL LOW (ref 36.0–46.0)
Hemoglobin: 11.6 g/dL — ABNORMAL LOW (ref 12.0–15.0)
MCH: 27.2 pg (ref 26.0–34.0)
MCHC: 32.9 g/dL (ref 30.0–36.0)
MCV: 82.9 fL (ref 80.0–100.0)
Platelets: 292 10*3/uL (ref 150–400)
RBC: 4.26 MIL/uL (ref 3.87–5.11)
RDW: 13.7 % (ref 11.5–15.5)
WBC: 8.9 10*3/uL (ref 4.0–10.5)
nRBC: 0 % (ref 0.0–0.2)

## 2021-09-09 LAB — BASIC METABOLIC PANEL
Anion gap: 10 (ref 5–15)
BUN: 10 mg/dL (ref 6–20)
CO2: 24 mmol/L (ref 22–32)
Calcium: 9.4 mg/dL (ref 8.9–10.3)
Chloride: 100 mmol/L (ref 98–111)
Creatinine, Ser: 0.8 mg/dL (ref 0.44–1.00)
GFR, Estimated: >60 mL/min (ref >60)
Glucose, Bld: 113 mg/dL — ABNORMAL HIGH (ref 70–99)
Potassium: 3.4 mmol/L — ABNORMAL LOW (ref 3.5–5.1)
Sodium: 134 mmol/L — ABNORMAL LOW (ref 135–145)

## 2021-09-09 LAB — I-STAT BETA HCG BLOOD, ED (MC, WL, AP ONLY): I-stat hCG, quantitative: <5 m[IU]/mL (ref <5)

## 2021-09-09 LAB — TROPONIN I (HIGH SENSITIVITY): Troponin I (High Sensitivity): 3 ng/L (ref <18)

## 2021-09-09 LAB — D-DIMER, QUANTITATIVE: D-Dimer, Quant: 0.48 ug/mL-FEU (ref 0.00–0.50)

## 2021-09-09 MED ORDER — LIDOCAINE 5 % EX PTCH
1.0000 | MEDICATED_PATCH | Freq: Once | CUTANEOUS | Status: DC
  Administered 2021-09-09: 1 via TRANSDERMAL
  Filled 2021-09-09: qty 1

## 2021-09-09 MED ORDER — KETOROLAC TROMETHAMINE 30 MG/ML IJ SOLN
30.0000 mg | Freq: Once | INTRAMUSCULAR | Status: AC
  Administered 2021-09-09: 30 mg via INTRAMUSCULAR
  Filled 2021-09-09: qty 1

## 2021-09-09 MED ORDER — KETOROLAC TROMETHAMINE 30 MG/ML IJ SOLN: 30.0000 mg | Freq: Once | INTRAMUSCULAR | Status: DC

## 2021-09-09 MED ORDER — ACETAMINOPHEN 325 MG PO TABS
650.0000 mg | ORAL_TABLET | Freq: Once | ORAL | Status: AC
  Administered 2021-09-09: 650 mg via ORAL
  Filled 2021-09-09: qty 2

## 2021-09-09 NOTE — ED Triage Notes (Signed)
Pt states had her wisdom teeth removed on Monday and placed on Penicillin. States yesterday developed facial swelling and center chest pain. States stopped Penicillin yesterday and started Clindamycin. C/o SOB on exertion. No distress at this time. Speaking in complete sentence.

## 2021-09-09 NOTE — ED Triage Notes (Signed)
Pt was [facility]. Reports wisdom teeth removed Monday, began having facial swelling and mid chest pain yesterday. Pain increases with movement. Denies sob, no resp distress is noted.

## 2021-09-09 NOTE — ED Provider Notes (Signed)
[facility] EMERGENCY DEPARTMENT Provider Note   CSN: [id number] Arrival date & time: 09/09/21  1326     History Chief Complaint  Patient presents with   Chest Pain    Alexandra Huerta is a 30 y.o. female.   Chest Pain Associated symptoms: no abdominal pain, no back pain, no cough, no dizziness, no fever, no headache, no nausea, no palpitations, no shortness of breath, no vomiting and no weakness    30 year old with no significant PMHx who presents with complaint of chest pain.  Patient states that she has had 2 days of intermittent midsternal, nonradiating chest pain of unclear etiology.  She has never had pain like this before.  It is worse with exertion and lying down, and improved spontaneously without intervention.  She underwent excision of her wisdom teeth under general anesthesia on Monday, and the symptoms started 2 days ago.  She was initially taking amoxicillin, notes that she had swelling of her bilateral cheeks and called her dentist, who switched her antibiotic to clindamycin.  She reports that she took 2 days of clindamycin, and stopped when the chest pain began.  She has been taking ibuprofen at home with minimal relief.  She denies any other associated symptoms and has otherwise been feeling well.  She denies any injuries.  She does vape but denies any other drug or tobacco use.  Denies history of blood clots.   Past Medical History:  Diagnosis Date   Headache     Patient Active Problem List   Diagnosis Date Noted   Migraine with aura and with status migrainosus, not intractable 01/03/2019    Past Surgical History:  Procedure Laterality Date   CESAREAN SECTION  2010, 2016, 2017     OB History   No obstetric history on file.     Family History  Problem Relation Age of Onset   Lupus Mother     Social History   Tobacco Use   Smoking status: Every Day    Packs/day: 0.50    Types: Cigarettes   Smokeless tobacco: Never   Tobacco  comments:    09/05/18 5 cigs daily  Substance Use Topics   Alcohol use: Not Currently   Drug use: Never    Home Medications Prior to Admission medications   Medication Sig Start Date End Date Taking? Authorizing Provider  acetaminophen (TYLENOL) 500 MG tablet Take 1,000 mg by mouth every 6 (six) hours as needed for headache (pain). Patient not taking: Reported on 08/25/2021    [provider]  amitriptyline (ELAVIL) 25 MG tablet Take 1 tablet (25 mg total) by mouth at bedtime. 09/05/18   Penumalli, Glenford Bayley, MD  chlorhexidine (PERIDEX) 0.12 % solution SMARTSIG:15 By Mouth Twice Daily 09/06/21   [provider]  clindamycin (CLEOCIN) 300 MG capsule Take 300 mg by mouth every 6 (six) hours. 09/08/21   [provider]  fluticasone Aleda Grana) 50 MCG/ACT nasal spray SMARTSIG:1-2 Spray(s) Both Nares Daily PRN 08/02/21   [provider]  ibuprofen (ADVIL) 600 MG tablet Take 1 tablet (600 mg total) by mouth every 6 (six) hours as needed for fever or moderate pain. Patient not taking: Reported on 08/25/2021 03/30/19   Shaune Pollack, MD  levocetirizine (XYZAL) 5 MG tablet Take 5 mg by mouth at bedtime. 08/02/21   [provider]  montelukast (SINGULAIR) 10 MG tablet SMARTSIG:1 Tablet(s) By Mouth Every Evening 08/02/21   [provider]  ondansetron (ZOFRAN ODT) 4 MG disintegrating tablet Take 1 tablet (  4 mg total) by mouth every 8 (eight) hours as needed for nausea or vomiting. Patient not taking: Reported on 08/25/2021 03/30/19   Duffy Bruce, MD  rizatriptan (MAXALT-MLT) 10 MG disintegrating tablet Take 1 tablet (10 mg total) by mouth as needed for migraine. May repeat in 2 hours if needed Patient not taking: No sig reported 09/05/18   Penumalli, Earlean Polka, MD    Allergies    Penicillins and Latex  Review of Systems   Review of Systems  Constitutional:  Negative for activity change, appetite change, chills and fever.  HENT:  Positive for  facial swelling. Negative for congestion, ear pain, mouth sores, nosebleeds and sore throat.   Eyes:  Negative for pain and visual disturbance.  Respiratory:  Negative for cough, chest tightness and shortness of breath.   Cardiovascular:  Positive for chest pain. Negative for palpitations and leg swelling.  Gastrointestinal:  Negative for abdominal distention, abdominal pain, blood in stool, constipation, diarrhea, nausea and vomiting.  Genitourinary:  Negative for dysuria and hematuria.  Musculoskeletal:  Negative for arthralgias and back pain.  Skin:  Negative for color change, pallor and rash.  Neurological:  Negative for dizziness, tremors, seizures, syncope, facial asymmetry, weakness, light-headedness and headaches.  Hematological:  Does not bruise/bleed easily.  Psychiatric/Behavioral:  Negative for confusion. The patient is not nervous/anxious.   All other systems reviewed and are negative.  Physical Exam Updated Vital Signs BP 124/80   Pulse 78   Temp 98.6 F (37 C) (Oral)   Resp 18   LMP 08/10/2021   SpO2 100%   Physical Exam Vitals and nursing note reviewed.  Constitutional:      General: She is not in acute distress.    Appearance: Normal appearance. She is well-developed and normal weight. She is not ill-appearing or toxic-appearing.  HENT:     Head: Normocephalic and atraumatic. No right periorbital erythema or left periorbital erythema.     Jaw: There is normal jaw occlusion. Pain on movement present. No trismus, tenderness or swelling.     Right Ear: External ear normal.     Left Ear: External ear normal.     Nose: Nose normal.     Mouth/Throat:     Mouth: Mucous membranes are moist.  Eyes:     General: No scleral icterus.    Extraocular Movements: Extraocular movements intact.     Conjunctiva/sclera: Conjunctivae normal.     Pupils: Pupils are equal, round, and reactive to light.  Cardiovascular:     Rate and Rhythm: Normal rate and regular rhythm.      Heart sounds: No murmur heard. Pulmonary:     Effort: Pulmonary effort is normal. No respiratory distress.     Breath sounds: Normal breath sounds.  Chest:     Chest wall: No tenderness or crepitus.  Abdominal:     General: There is no distension.     Palpations: Abdomen is soft.     Tenderness: There is no abdominal tenderness.  Musculoskeletal:        General: Normal range of motion.     Cervical back: Normal range of motion and neck supple. No rigidity or tenderness.     Right lower leg: No edema.     Left lower leg: No edema.  Lymphadenopathy:     Cervical: No cervical adenopathy.  Skin:    General: Skin is warm and dry.     Capillary Refill: Capillary refill takes less than 2 seconds.  Neurological:  General: No focal deficit present.     Mental Status: She is alert and oriented to person, place, and time. Mental status is at baseline.  Psychiatric:        Mood and Affect: Mood normal.        Behavior: Behavior normal.    ED Results / Procedures / Treatments   Labs (all labs ordered are listed, but only abnormal results are displayed) Labs Reviewed  BASIC METABOLIC PANEL - Abnormal; Notable for the following components:      Result Value   Sodium 134 (*)    Potassium 3.4 (*)    Glucose, Bld 113 (*)    All other components within normal limits  CBC - Abnormal; Notable for the following components:   Hemoglobin 11.6 (*)    HCT 35.3 (*)    All other components within normal limits  D-DIMER, QUANTITATIVE  I-STAT BETA HCG BLOOD, ED ([facility], [facility], [facility] ONLY)  TROPONIN I (HIGH SENSITIVITY)    EKG EKG Interpretation  Date/Time:  Thursday September 09 2021 13:25:17 EDT Ventricular Rate:  88 PR Interval:  118 QRS Duration: 80 QT Interval:  362 QTC Calculation: 438 R Axis:   80 Text Interpretation: Normal sinus rhythm with sinus arrhythmia Right atrial enlargement T wave abnormality, consider inferior ischemia T wave abnormality, consider anterolateral ischemia Abnormal  ECG No old tracing to compare Confirmed by Varney Biles [phone]) on 09/09/2021 4:43:09 PM  Radiology DG Chest 2 View  Result Date: 09/09/2021 CLINICAL DATA:  Reason for exam: Chest pain Per ED Triage Note: "[facility]. Reports wisdom teeth removed Monday, began having facial swelling and mid chest pain yesterday. Pain increases with movement. Denies sob, no resp distress is noted."cp EXAM: CHEST - 2 VIEW COMPARISON:  None. FINDINGS: Normal mediastinum and cardiac silhouette. Normal pulmonary vasculature. No evidence of effusion, infiltrate, or pneumothorax. No acute bony abnormality. IMPRESSION: Normal chest radiograph. Electronically Signed   By: Suzy Bouchard M.D.   On: 09/09/2021 14:53    Procedures Procedures   Medications Ordered in ED Medications  lidocaine (LIDODERM) 5 % 1 patch (1 patch Transdermal Patch Applied 09/09/21 1807)  acetaminophen (TYLENOL) tablet 650 mg (650 mg Oral Given 09/09/21 1807)  ketorolac (TORADOL) 30 MG/ML injection 30 mg (30 mg Intramuscular Given 09/09/21 1954)    ED Course  I have reviewed the triage vital signs and the nursing notes.  Pertinent labs & imaging results that were available during my care of the patient were reviewed by me and considered in my medical decision making (see chart for details).    MDM Rules/Calculators/A&P HEAR Score: 2                         Alexandra Huerta is a 30 y.o. female presenting with CP. VS wnl. HEAR score 2.   EKG interpretation: Sinus arrhythmia, normal intervals, prior ST elevations or depressions, right atrial enlargement, T wave inversions in leads II, III, aVF, leads V2 through V6, early repolarization in aVL.  No prior EKGs for comparison.  Labs: Mild hypokalemia and hyponatremia, CBC with mild microcytic anemia.  Troponin of 3.  Beta hCG unremarkable.  D-dimer of 0.48.  Imaging: CXR without cardiopulmonary abnormality. Imaging reviewed by radiology and personally by me.  DDX considered:  ACS, PE, pneumonia, pneumothorax, arrhythmia, costochondritis, myocarditis, rib fracture, pericardial tamponade, GERD, aortic dissection. History, examination findings, and objective data most consistent with chest pain of unclear etiology, potentially secondary to  recent anesthesia.  Very low suspicion for emergent etiology at this time.  Given patient's HEAR score and troponin, low suspicion for ACS and feel safe to discharge this patient home at this time given overall well appearance. Low suspicion for PE, r/o for PE with YEARS algorithm and d-dimer. PE findings not suggestive of DVT.  She is overall very well-appearing, appears comfortable on repeat exams.  Medications: Medications  lidocaine (LIDODERM) 5 % 1 patch (1 patch Transdermal Patch Applied 09/09/21 1807)  acetaminophen (TYLENOL) tablet 650 mg (650 mg Oral Given 09/09/21 1807)  ketorolac (TORADOL) 30 MG/ML injection 30 mg (30 mg Intramuscular Given 09/09/21 1954)    Patient re-evaluated prior to discharge. Hemodynamically stable and in no acute distress. Chest pain has not worsened.  Discharged home in stable condition. Strict ED return precautions advised and discussed at length. Supportive care discussed. Advised close PCP follow up.  Advised to contact dentist regarding restart of antibiotic versus postoperative pain control.  Patient understands and agrees with the plan.  The plan for this patient was discussed with my attending physician, who voiced agreement and who oversaw evaluation and treatment of this patient.     Note: Estate manager/land agent was used in the creation of this note.  Final Clinical Impression(s) / ED Diagnoses Final diagnoses:  Chest pain, unspecified type    Rx / DC Orders ED Discharge Orders     None        Cherly Hensen, DO 09/10/21 0013    Varney Biles, MD 09/10/21 [phone]

## 2021-09-09 NOTE — Discharge Instructions (Signed)
Dear Alexandra Huerta,  Thank you for allowing [place] to take care of you today.  We hope you begin feeling better soon.  - Please follow-up with your primary care physician or schedule an appointment to establish a primary care doctor if you do not have one already. - Please return to the Emergency Department or call 911 for chest pain, shortness of breath, severe pain, altered mental status, or if you have any reason to think you may need emergency medical care. -Call your PCP for close follow-up in the next week -Remember to stay well-hydrated -May use lidocaine patches to your chest every 24 hours as needed for pain -Turning ibuprofen and Tylenol as discussed.  You do not need to take further ibuprofen/Motrin tonight, as you were given Toradol in the ED which is a similar medication. -Use ice or heat to the chest wall as needed.  Do not place directly on skin -Please call your dentist to discuss restarting a new antibiotic due to the concerns about allergic reaction.  You may also discuss a recheck given that you are still having pain.   Sincerely,  Dwaine Gale, DO Department of Emergency Medicine [place]   Chest pain, unspecified type

## 2021-09-09 NOTE — ED Notes (Signed)
Patient is being discharged from the Urgent Care and sent to the Emergency Department via POV . Per Izola Price, [place], patient is in need of higher level of care due to need of further evaluation. Patient is aware and verbalizes understanding of plan of care.  Vitals:   09/09/21 1303  BP: 121/78  Pulse: 93  Resp: 18  Temp: 98.2 F (36.8 C)  SpO2: 96%

## 2021-09-09 NOTE — ED Provider Notes (Signed)
Emergency Medicine Provider Triage Evaluation Note  Alexandra Huerta , a 30 y.o. female  was evaluated in triage.  Pt complains of intermittent chest pain x2 days.  Patient had her wisdom teeth removed on Monday 10/31, and has persistent facial swelling.  She states that she is having multiple episodes of sharp stabbing pain in the middle of her chest, lasting for several minutes.  With that she has associated shortness of breath as the pain takes her breath away.  She has been taking ibuprofen with minimal relief.  Review of Systems  Positive: Chest pain, facial swelling Negative: Fevers, chills, cough, sore throat, difficulty swallowing, nausea, vomiting  Physical Exam  BP 124/83 (BP Location: Right Arm)   Pulse 86   Temp 98.6 F (37 C) (Oral)   Resp 15   LMP 08/10/2021   SpO2 97%  Gen:   Awake, no distress   Resp:  Normal effort  MSK:   Moves extremities without difficulty  Other:  Bilateral diffuse swelling of the external jaw with no significant tenderness to palpation  Medical Decision Making  Medically screening exam initiated at 1:50 PM.  Appropriate orders placed.  Brand Males was informed that the remainder of the evaluation will be completed by another provider, this initial triage assessment does not replace that evaluation, and the importance of remaining in the ED until their evaluation is complete.     Jeanella Flattery 09/09/21 1351    Gerhard Munch, MD 09/09/21 1625

## 2021-09-14 ENCOUNTER — Ambulatory Visit: Payer: Medicaid Other

## 2021-09-14 NOTE — Progress Notes (Signed)
Please enter orders for PAT visit scheduled for 09-16-21 

## 2021-09-14 NOTE — Patient Instructions (Addendum)
DUE TO COVID-19 ONLY ONE VISITOR IS ALLOWED TO COME WITH YOU AND STAY IN THE WAITING ROOM ONLY DURING PRE OP AND PROCEDURE DAY OF SURGERY IF YOU ARE GOING HOME AFTER SURGERY.Marland Kitchen               Alexandra Huerta     Your procedure is scheduled on: 09/17/21   Report to [facility] Main  Entrance   Report to admitting at  1:45 PM     Call this number if you have problems the morning of surgery [phone]    Remember: Do not eat food  :After Midnight the night before your surgery,   You may have clear liquids from midnight until ---.10:00 am    CLEAR LIQUID DIET   Foods Allowed                                                                     Foods Excluded  No milk or creamer Coffee and tea, regular and decaf                             liquids that you cannot  Plain Jell-O any favor except red or purple                                           see through such as: Fruit ices (not with fruit pulp)                                     milk, soups, orange juice  Iced Popsicles                                    All solid food Carbonated beverages, regular and diet                                    Cranberry, grape and apple juices Sports drinks like Gatorade Lightly seasoned clear broth or consume(fat free) Sugar  BRUSH YOUR TEETH MORNING OF SURGERY AND RINSE YOUR MOUTH OUT, NO CHEWING GUM CANDY OR MINTS.    Take these medicines the morning of surgery with A SIP OF WATER: Elivil,pantoprazole.                          You may not have any metal on your body including hair pins and              piercings  Do not wear jewelry, make-up, lotions, powders or perfumes, deodorant             Do not wear nail polish on your fingernails.  Do not shave  48 hours prior to surgery.               Do not bring valuables to the hospital. [facility] IS NOT  RESPONSIBLE   FOR VALUABLES.  Contacts, dentures or bridgework may not be worn into surgery.       Patients  discharged the day of surgery will not be allowed to drive home.   IF YOU ARE HAVING SURGERY AND GOING HOME THE SAME DAY, YOU MUST HAVE AN ADULT TO DRIVE YOU HOME AND BE WITH YOU FOR 24 HOURS. YOU MAY GO HOME BY TAXI OR UBER OR ORTHERWISE, BUT AN ADULT MUST ACCOMPANY YOU HOME AND STAY WITH YOU FOR 24 HOURS.  Name and phone number of your driver:  Special Instructions: N/A              Please read over the following fact sheets you were given: _____________________________________________________________________          [facility] - Preparing for Surgery Before surgery, you can play an important role.  Because skin is not sterile, your skin needs to be as free of germs as possible.  You can reduce the number of germs on your skin by washing with CHG (chlorahexidine gluconate) soap before surgery.  CHG is an antiseptic cleaner which kills germs and bonds with the skin to continue killing germs even after washing. Please DO NOT use if you have an allergy to CHG or antibacterial soaps.  If your skin becomes reddened/irritated stop using the CHG and inform your nurse when you arrive at Short Stay. Do not shave (including legs and underarms) for at least 48 hours prior to the first CHG shower.  Please follow these instructions carefully:  1.  Shower with CHG Soap the night before surgery and the  morning of Surgery.  2.  If you choose to wash your hair, wash your hair first as usual with your  normal  shampoo.  3.  After you shampoo, rinse your hair and body thoroughly to remove the  shampoo.                            4.  Use CHG as you would any other liquid soap.  You can apply chg directly  to the skin and wash                       Gently with a scrungie or clean washcloth.  5.  Apply the CHG Soap to your body ONLY FROM THE NECK DOWN.   Do not use on face/ open                           Wound or open sores. Avoid contact with eyes, ears mouth and genitals (private parts).                        Wash face,  Genitals (private parts) with your normal soap.             6.  Wash thoroughly, paying special attention to the area where your surgery  will be performed.  7.  Thoroughly rinse your body with warm water from the neck down.  8.  DO NOT shower/wash with your normal soap after using and rinsing off  the CHG Soap.                9.  Pat yourself dry with a clean towel.            10.  Wear clean pajamas.  11.  Place clean sheets on your bed the night of your first shower and do not  sleep with pets. Day of Surgery : Do not apply any lotions/deodorants the morning of surgery.  Please wear clean clothes to the hospital/surgery center.  FAILURE TO FOLLOW THESE INSTRUCTIONS MAY RESULT IN THE CANCELLATION OF YOUR SURGERY PATIENT SIGNATURE_________________________________  NURSE SIGNATURE__________________________________  ________________________________________________________________________

## 2021-09-16 ENCOUNTER — Encounter (HOSPITAL_COMMUNITY): Payer: Self-pay

## 2021-09-16 ENCOUNTER — Other Ambulatory Visit: Payer: Self-pay | Admitting: Diagnostic Neuroimaging

## 2021-09-16 ENCOUNTER — Other Ambulatory Visit: Payer: Self-pay

## 2021-09-16 ENCOUNTER — Encounter (HOSPITAL_COMMUNITY)
Admission: RE | Admit: 2021-09-16 | Discharge: 2021-09-16 | Disposition: A | Discharge status: Home / Self Care | Payer: Medicaid Other | Source: Ambulatory Visit | Attending: Podiatry | Admitting: Podiatry

## 2021-09-16 VITALS — BP 110/76 | HR 82 | Temp 98.6°F | Ht 60.0 in | Wt 139.0 lb

## 2021-09-16 DIAGNOSIS — Z01818 Encounter for other preprocedural examination: Secondary | ICD-10-CM

## 2021-09-16 DIAGNOSIS — Z01812 Encounter for preprocedural laboratory examination: Secondary | ICD-10-CM | POA: Diagnosis not present

## 2021-09-16 HISTORY — DX: Anemia, unspecified: D64.9

## 2021-09-16 NOTE — Progress Notes (Signed)
COVID Vaccine Completed: NO Date COVID Vaccine completed: COVID vaccine manufacturer: Pfizer    [facility] [employer]  COVID Test: N/A  PCP - [facility] Cardiologist -   Chest x-ray - 09/09/21 EKG - 09/10/21 EPIC Stress Test -  ECHO -  Cardiac Cath -  Pacemaker/ICD device last checked:  Sleep Study -  CPAP -   Fasting Blood Sugar -  Checks Blood Sugar _____ times a day  Blood Thinner Instructions: Aspirin Instructions: Last Dose:  Anesthesia review: Hx: smoker.  Patient denies shortness of breath, fever, cough and chest pain at PAT appointment   Patient verbalized understanding of instructions that were given to them at the PAT appointment. Patient was also instructed that they will need to review over the PAT instructions again at home before surgery.

## 2021-09-16 NOTE — Progress Notes (Signed)
Pt. Is schedule for surgery tomorrow: 09/17/21. Please fax her H & P. A message was left at Dr. Inda Merlin care coordinator phone machine as well.

## 2021-09-17 ENCOUNTER — Ambulatory Visit (HOSPITAL_COMMUNITY): Payer: Medicaid Other | Admitting: Anesthesiology

## 2021-09-17 ENCOUNTER — Encounter (HOSPITAL_COMMUNITY): Payer: Self-pay | Admitting: Podiatry

## 2021-09-17 ENCOUNTER — Encounter (HOSPITAL_COMMUNITY)
Admission: RE | Disposition: A | Discharge status: Home / Self Care | Payer: Self-pay | Source: Ambulatory Visit | Attending: Podiatry

## 2021-09-17 ENCOUNTER — Ambulatory Visit (HOSPITAL_COMMUNITY)
Admission: RE | Admit: 2021-09-17 | Discharge: 2021-09-17 | Disposition: A | Discharge status: Home / Self Care | Payer: Medicaid Other | Source: Ambulatory Visit | Attending: Podiatry | Admitting: Podiatry

## 2021-09-17 DIAGNOSIS — M8970 Major osseous defect, unspecified site: Secondary | ICD-10-CM

## 2021-09-17 DIAGNOSIS — Z87891 Personal history of nicotine dependence: Secondary | ICD-10-CM | POA: Insufficient documentation

## 2021-09-17 DIAGNOSIS — M2011 Hallux valgus (acquired), right foot: Secondary | ICD-10-CM

## 2021-09-17 DIAGNOSIS — Z01818 Encounter for other preprocedural examination: Secondary | ICD-10-CM

## 2021-09-17 HISTORY — PX: HALLUX VALGUS LAPIDUS: SHX6626

## 2021-09-17 LAB — PREGNANCY, URINE: Preg Test, Ur: NEGATIVE

## 2021-09-17 SURGERY — BUNIONECTOMY, LAPIDUS
Anesthesia: Regional | Laterality: Right

## 2021-09-17 MED ORDER — DEXAMETHASONE SODIUM PHOSPHATE 10 MG/ML IJ SOLN
INTRAMUSCULAR | Status: DC | PRN
  Administered 2021-09-17: 5 mg

## 2021-09-17 MED ORDER — ORAL CARE MOUTH RINSE: 15.0000 mL | Freq: Once | OROMUCOSAL | Status: AC

## 2021-09-17 MED ORDER — DEXAMETHASONE SODIUM PHOSPHATE 10 MG/ML IJ SOLN
INTRAMUSCULAR | Status: DC | PRN
  Administered 2021-09-17: 10 mg via INTRAVENOUS

## 2021-09-17 MED ORDER — PHENYLEPHRINE HCL-NACL 20-0.9 MG/250ML-% IV SOLN
INTRAVENOUS | Status: DC | PRN
  Administered 2021-09-17: 15 ug/min via INTRAVENOUS

## 2021-09-17 MED ORDER — OXYCODONE HCL 5 MG PO TABS: 5.0000 mg | ORAL_TABLET | ORAL | 0 refills | Status: AC | PRN

## 2021-09-17 MED ORDER — FENTANYL CITRATE (PF) 250 MCG/5ML IJ SOLN
INTRAMUSCULAR | Status: DC | PRN
  Administered 2021-09-17 (×2): 25 ug via INTRAVENOUS
  Administered 2021-09-17: 50 ug via INTRAVENOUS
  Administered 2021-09-17 (×2): 25 ug via INTRAVENOUS

## 2021-09-17 MED ORDER — FENTANYL CITRATE PF 50 MCG/ML IJ SOSY: 25.0000 ug | PREFILLED_SYRINGE | INTRAMUSCULAR | Status: DC | PRN

## 2021-09-17 MED ORDER — MIDAZOLAM HCL 2 MG/2ML IJ SOLN
1.0000 mg | Freq: Once | INTRAMUSCULAR | Status: AC
  Administered 2021-09-17: 2 mg via INTRAVENOUS
  Filled 2021-09-17: qty 2

## 2021-09-17 MED ORDER — ALBUTEROL SULFATE HFA 108 (90 BASE) MCG/ACT IN AERS
INHALATION_SPRAY | RESPIRATORY_TRACT | Status: AC
  Filled 2021-09-17: qty 6.7

## 2021-09-17 MED ORDER — IBUPROFEN 600 MG PO TABS: 600.0000 mg | ORAL_TABLET | Freq: Four times a day (QID) | ORAL | 0 refills | Status: AC | PRN

## 2021-09-17 MED ORDER — FENTANYL CITRATE (PF) 250 MCG/5ML IJ SOLN
INTRAMUSCULAR | Status: AC
  Filled 2021-09-17: qty 5

## 2021-09-17 MED ORDER — CHLORHEXIDINE GLUCONATE 0.12 % MT SOLN
15.0000 mL | Freq: Once | OROMUCOSAL | Status: AC
  Administered 2021-09-17: 15 mL via OROMUCOSAL

## 2021-09-17 MED ORDER — FENTANYL CITRATE PF 50 MCG/ML IJ SOSY
50.0000 ug | PREFILLED_SYRINGE | Freq: Once | INTRAMUSCULAR | Status: AC
  Administered 2021-09-17: 50 ug via INTRAVENOUS
  Filled 2021-09-17: qty 2

## 2021-09-17 MED ORDER — OXYCODONE HCL 5 MG/5ML PO SOLN: 5.0000 mg | Freq: Once | ORAL | Status: DC | PRN

## 2021-09-17 MED ORDER — ACETAMINOPHEN 500 MG PO TABS: 1000.0000 mg | ORAL_TABLET | Freq: Four times a day (QID) | ORAL | 0 refills | Status: AC | PRN

## 2021-09-17 MED ORDER — PHENYLEPHRINE HCL (PRESSORS) 10 MG/ML IV SOLN
INTRAVENOUS | Status: AC
  Filled 2021-09-17: qty 1

## 2021-09-17 MED ORDER — MIDAZOLAM HCL 2 MG/2ML IJ SOLN
INTRAMUSCULAR | Status: DC | PRN
  Administered 2021-09-17: 1 mg via INTRAVENOUS

## 2021-09-17 MED ORDER — ONDANSETRON HCL 4 MG/2ML IJ SOLN
INTRAMUSCULAR | Status: DC | PRN
  Administered 2021-09-17: 4 mg via INTRAVENOUS

## 2021-09-17 MED ORDER — PROPOFOL 10 MG/ML IV BOLUS
INTRAVENOUS | Status: DC | PRN
  Administered 2021-09-17: 200 mg via INTRAVENOUS

## 2021-09-17 MED ORDER — ACETAMINOPHEN 500 MG PO TABS
1000.0000 mg | ORAL_TABLET | Freq: Once | ORAL | Status: AC
  Administered 2021-09-17: 1000 mg via ORAL
  Filled 2021-09-17: qty 2

## 2021-09-17 MED ORDER — ROPIVACAINE HCL 5 MG/ML IJ SOLN
INTRAMUSCULAR | Status: DC | PRN
  Administered 2021-09-17: 30 mL via PERINEURAL

## 2021-09-17 MED ORDER — PROPOFOL 10 MG/ML IV BOLUS
INTRAVENOUS | Status: AC
  Filled 2021-09-17: qty 20

## 2021-09-17 MED ORDER — MIDAZOLAM HCL 2 MG/2ML IJ SOLN
INTRAMUSCULAR | Status: AC
  Filled 2021-09-17: qty 2

## 2021-09-17 MED ORDER — GABAPENTIN 300 MG PO CAPS: 300.0000 mg | ORAL_CAPSULE | Freq: Three times a day (TID) | ORAL | 0 refills | Status: DC

## 2021-09-17 MED ORDER — LACTATED RINGERS IV SOLN: INTRAVENOUS | Status: DC

## 2021-09-17 MED ORDER — SODIUM CHLORIDE 0.9 % IR SOLN
Status: DC | PRN
  Administered 2021-09-17: 1000 mL

## 2021-09-17 MED ORDER — VANCOMYCIN HCL IN DEXTROSE 1-5 GM/200ML-% IV SOLN
1000.0000 mg | INTRAVENOUS | Status: AC
  Administered 2021-09-17: 1000 mg via INTRAVENOUS
  Filled 2021-09-17: qty 200

## 2021-09-17 MED ORDER — LIDOCAINE 2% (20 MG/ML) 5 ML SYRINGE
INTRAMUSCULAR | Status: DC | PRN
  Administered 2021-09-17: 40 mg via INTRAVENOUS

## 2021-09-17 MED ORDER — PROMETHAZINE HCL 25 MG/ML IJ SOLN: 6.2500 mg | INTRAMUSCULAR | Status: DC | PRN

## 2021-09-17 MED ORDER — OXYCODONE HCL 5 MG PO TABS: 5.0000 mg | ORAL_TABLET | Freq: Once | ORAL | Status: DC | PRN

## 2021-09-17 MED ORDER — BUPIVACAINE HCL (PF) 0.5 % IJ SOLN
INTRAMUSCULAR | Status: AC
  Filled 2021-09-17: qty 30

## 2021-09-17 MED ORDER — LIDOCAINE HCL 2 % IJ SOLN
INTRAMUSCULAR | Status: AC
  Filled 2021-09-17: qty 40

## 2021-09-17 SURGICAL SUPPLY — 78 items
BAG COUNTER SPONGE SURGICOUNT (BAG) ×3 IMPLANT
BLADE AVERAGE 25X9 (BLADE) ×3 IMPLANT
BLADE OSC/SAG .038X5.5 CUT EDG (BLADE) ×3 IMPLANT
BLADE SAW LAPIPLASTY 40X11 (BLADE) ×2 IMPLANT
BLADE SURG 15 STRL LF DISP TIS (BLADE) ×4 IMPLANT
BLADE SURG 15 STRL SS (BLADE) ×2
BNDG ELASTIC 3X5.8 VLCR STR LF (GAUZE/BANDAGES/DRESSINGS) ×3 IMPLANT
BNDG ELASTIC 4X5.8 VLCR STR LF (GAUZE/BANDAGES/DRESSINGS) ×3 IMPLANT
BNDG ESMARK 4X9 LF (GAUZE/BANDAGES/DRESSINGS) IMPLANT
BNDG GAUZE ELAST 4 BULKY (GAUZE/BANDAGES/DRESSINGS) ×3 IMPLANT
CHLORAPREP W/TINT 26 (MISCELLANEOUS) ×3 IMPLANT
CNTNR URN SCR LID CUP LEK RST (MISCELLANEOUS) IMPLANT
CONT SPEC 4OZ STRL OR WHT (MISCELLANEOUS)
COVER BACK TABLE 60X90IN (DRAPES) ×3 IMPLANT
CUFF TOURN SGL QUICK 18X4 (TOURNIQUET CUFF) IMPLANT
CUFF TOURN SGL QUICK 24 (TOURNIQUET CUFF)
CUFF TRNQT CYL 24X4X16.5-23 (TOURNIQUET CUFF) IMPLANT
DRAPE 3/4 80X56 (DRAPES) ×3 IMPLANT
DRAPE EXTREMITY T 121X128X90 (DISPOSABLE) ×3 IMPLANT
DRAPE IMP U-DRAPE 54X76 (DRAPES) ×3 IMPLANT
DRAPE OEC MINIVIEW 54X84 (DRAPES) ×3 IMPLANT
DRAPE SHEET LG 3/4 BI-LAMINATE (DRAPES) ×3 IMPLANT
DRAPE U-SHAPE 47X51 STRL (DRAPES) ×3 IMPLANT
DRSG EMULSION OIL 3X3 NADH (GAUZE/BANDAGES/DRESSINGS) ×3 IMPLANT
ELECT REM PT RETURN 15FT ADLT (MISCELLANEOUS) ×3 IMPLANT
FASTGRAFTER AUTOGRAFT HARVESTING SYSTEM ×1 IMPLANT
GAUZE 4X4 16PLY ~~LOC~~+RFID DBL (SPONGE) IMPLANT
GAUZE SPONGE 4X4 12PLY STRL (GAUZE/BANDAGES/DRESSINGS) ×3 IMPLANT
GAUZE XEROFORM 1X8 LF (GAUZE/BANDAGES/DRESSINGS) IMPLANT
GLOVE SRG 8 PF TXTR STRL LF DI (GLOVE) ×1 IMPLANT
GLOVE SURG ENC MOIS LTX SZ7.5 (GLOVE) ×1 IMPLANT
GLOVE SURG UNDER POLY LF SZ8 (GLOVE)
GOWN STRL REUS W/ TWL LRG LVL3 (GOWN DISPOSABLE) ×2 IMPLANT
GOWN STRL REUS W/ TWL XL LVL3 (GOWN DISPOSABLE) ×2 IMPLANT
GOWN STRL REUS W/TWL LRG LVL3 (GOWN DISPOSABLE) ×1
GOWN STRL REUS W/TWL XL LVL3 (GOWN DISPOSABLE) ×1
IMPL LAPIPLASTY LESSER TMT FIX (Orthopedic Implant) ×2 IMPLANT
INST FASTGRAFTER HARVEST SYS (INSTRUMENTS) ×7
INST GUIDED SPEEDRELEASE (INSTRUMENTS) ×3
INSTRUMENT FASTGRFTR HRVST SYS (INSTRUMENTS) ×2 IMPLANT
INSTRUMENT GUIDED SPEEDRELEASE (INSTRUMENTS) ×1 IMPLANT
KIT BASIN OR (CUSTOM PROCEDURE TRAY) ×3 IMPLANT
KIT TURNOVER KIT A (KITS) IMPLANT
LAPIPLASTY SYSTEM 2 (Orthopedic Implant) ×3 IMPLANT
MANIFOLD NEPTUNE II (INSTRUMENTS) ×3 IMPLANT
NDL HYPO 25X1 1.5 SAFETY (NEEDLE) ×2 IMPLANT
NDL SAFETY ECLIPSE 18X1.5 (NEEDLE) ×4 IMPLANT
NEEDLE HYPO 18GX1.5 SHARP (NEEDLE) ×2
NEEDLE HYPO 25X1 1.5 SAFETY (NEEDLE) ×6 IMPLANT
NS IRRIG 1000ML POUR BTL (IV SOLUTION) ×2 IMPLANT
PADDING CAST ABS 4INX4YD NS (CAST SUPPLIES) ×1
PADDING CAST ABS COTTON 4X4 ST (CAST SUPPLIES) ×2 IMPLANT
PENCIL SMOKE EVACUATOR (MISCELLANEOUS) ×3 IMPLANT
SET IRRIG Y TYPE TUR BLADDER L (SET/KITS/TRAYS/PACK) IMPLANT
SPONGE T-LAP 4X18 ~~LOC~~+RFID (SPONGE) ×5 IMPLANT
STAPLER VISISTAT 35W (STAPLE) IMPLANT
STOCKINETTE 6  STRL (DRAPES) ×1
STOCKINETTE 6 STRL (DRAPES) ×2 IMPLANT
SUCTION FRAZIER HANDLE 10FR (MISCELLANEOUS)
SUCTION TUBE FRAZIER 10FR DISP (MISCELLANEOUS) IMPLANT
SUT ETHILON 3 0 PS 1 (SUTURE) ×2 IMPLANT
SUT ETHILON 4 0 PS 2 18 (SUTURE) IMPLANT
SUT MNCRL AB 3-0 PS2 18 (SUTURE) ×3 IMPLANT
SUT MNCRL AB 4-0 PS2 18 (SUTURE) ×3 IMPLANT
SUT MON AB 5-0 PS2 18 (SUTURE) ×3 IMPLANT
SUT VIC AB 2-0 SH 27 (SUTURE)
SUT VIC AB 2-0 SH 27XBRD (SUTURE) IMPLANT
SUT VIC AB 3-0 SH 27 (SUTURE) ×1
SUT VIC AB 3-0 SH 27X BRD (SUTURE) ×1 IMPLANT
SYR 10ML LL (SYRINGE) ×6 IMPLANT
SYR BULB EAR ULCER 3OZ GRN STR (SYRINGE) ×3 IMPLANT
SYR CONTROL 10ML LL (SYRINGE) ×3 IMPLANT
SYSTEM LAPIPLASTY 2 (Orthopedic Implant) ×1 IMPLANT
TOWEL OR 17X26 10 PK STRL BLUE (TOWEL DISPOSABLE) ×3 IMPLANT
TRAY PREP A LATEX SAFE STRL (SET/KITS/TRAYS/PACK) ×1 IMPLANT
TUBE IRRIGATION SET MISONIX (TUBING) IMPLANT
UNDERPAD 30X36 HEAVY ABSORB (UNDERPADS AND DIAPERS) ×3 IMPLANT
YANKAUER SUCT BULB TIP NO VENT (SUCTIONS) ×3 IMPLANT

## 2021-09-17 NOTE — Progress Notes (Signed)
Assisted Dr. Willette Alma with right, ultrasound guided, popliteal block. Side rails up, monitors on throughout procedure. See vital signs in flow sheet. Tolerated Procedure well.

## 2021-09-17 NOTE — Discharge Instructions (Addendum)
Post-Surgery Instructions  1. If you are recuperating from surgery anywhere other than home, please be sure to leave [place] a number where you can be reached. 2. Go directly home and rest. 3. The keep operated foot (or feet) elevated six inches above the hip when sitting or lying down. 4. Support the elevated foot and leg with pillows under the calf. DO NOT PLACE PILLOWS UNDER THE KNEE. 5. DO NOT REMOVE or get your bandages wet. This will increase your chances of getting an infection. 6. Wear your surgical shoe at all times when you are up. 7. A limited amount of pain and swelling may occur. The skin may take on a bruised appearance. This is no cause for alarm. 8. For slight pain and swelling, apply an ice pack directly over the bandage and behind the knee for 15 minutes every hour. Continue icing until seen in the office. DO NOT apply any form of heat to the area. 9. Have prescription(s) filled immediately and take as directed. 10. Drink lots of liquids, water, and juice. 11. CALL THE OFFICE IMMEDIATELY IF: a. Bleeding continues b. Pain increases and/or does not respond to medication c. Bandage or cast appears too tight d. Any liquids (water, coffee, etc.) have spilled on your bandages. e. Tripping, falling, or stubbing the surgical foot f. If your temperature rises above 101 g. If you have ANY questions at all 12. Please use the crutches, knee scooter, or walker you have prescribed, rented, or purchased. If you are non-weight bearing DO NOT put weight on the operated foot until you are cleared to do so  If you are weight-bearing, follow your physician's instructions. You are expected to be: ? weight-bearing ?X non-weight bearing 13. Special Instructions: If you are resting on couch / in bed you may remove the boot but must put the boot back on if you get up to move   _________________________________________________________________________________ _________________________________________________________________________________  14. Your next appointment is: 09/23/2021 9:15 AM  If you need to reach the nurse for any reason, please call: [place]: [phone] [place]: [phone] [place]: [phone]

## 2021-09-17 NOTE — Progress Notes (Signed)
Orthopedic Tech Progress Note Patient Details:  Alexandra Huerta Sep 29, 1991 [id number]  Ortho Devices Type of Ortho Device: Crutches, CAM walker Ortho Device/Splint Location: Right foot Ortho Device/Splint Interventions: Application, Adjustment   Post Interventions Patient Tolerated: Well  Genelle Bal Alexandra Huerta 09/17/2021, 7:38 PM

## 2021-09-17 NOTE — Anesthesia Procedure Notes (Signed)
Anesthesia Regional Block: Popliteal block   Pre-Anesthetic Checklist: , timeout performed,  Correct Patient, Correct Site, Correct Laterality,  Correct Procedure, Correct Position, site marked,  Risks and benefits discussed,  Surgical consent,  Pre-op evaluation,  At surgeon's request and post-op pain management  Laterality: Right  Prep: Maximum Sterile Barrier Precautions used, chloraprep       Needles:  Injection technique: Single-shot  Needle Type: Echogenic Stimulator Needle     Needle Length: 9cm  Needle Gauge: 22     Additional Needles:   Procedures:,,,, ultrasound used (permanent image in chart),,    Narrative:  Start time: 09/17/2021 2:48 PM End time: 09/17/2021 2:52 PM Injection made incrementally with aspirations every 5 mL.  Performed by: Personally  Anesthesiologist: Elmer Picker, MD  Additional Notes: Monitors applied. No increased pain on injection. No increased resistance to injection. Injection made in 5cc increments. Good needle visualization. Patient tolerated procedure well.

## 2021-09-17 NOTE — H&P (Signed)
History and Physical Interval Note:  09/17/2021 3:35 PM  Alexandra Huerta  has presented today for surgery, with the diagnosis of Right foot Hallux valgus.  The various methods of treatment have been discussed with the patient and family. After consideration of risks, benefits and other options for treatment, the patient has consented to   Procedure(s) with comments: HALLUX VALGUS LAPIDUS (Right) - WITH BLOCK AIKEN OSTEOTOMY (Right) GRAFT APPLICATION (Right) as a surgical intervention.  The patient's history has been reviewed, patient examined, no change in status, stable for surgery.  I have reviewed the patient's chart and labs.  Questions were answered to the patient's satisfaction.     Edwin Cap

## 2021-09-17 NOTE — Anesthesia Preprocedure Evaluation (Addendum)
Anesthesia Evaluation  Patient identified by MRN, date of birth, ID band Patient awake    Reviewed: Allergy & Precautions, NPO status , Patient's Chart, lab work & pertinent test results  History of Anesthesia Complications Negative for: history of anesthetic complications  Airway Mallampati: I  TM Distance: >3 FB Neck ROM: Full    Dental no notable dental hx. (+) Teeth Intact, Dental Advisory Given   Pulmonary Current Smoker and Patient abstained from smoking.,    Pulmonary exam normal breath sounds clear to auscultation       Cardiovascular negative cardio ROS Normal cardiovascular exam Rhythm:Regular Rate:Normal     Neuro/Psych  Headaches, negative psych ROS   GI/Hepatic Neg liver ROS, GERD  Medicated and Controlled,  Endo/Other  negative endocrine ROS  Renal/GU negative Renal ROS     Musculoskeletal negative musculoskeletal ROS (+)   Abdominal   Peds  Hematology negative hematology ROS (+)   Anesthesia Other Findings   Reproductive/Obstetrics                           Anesthesia Physical Anesthesia Plan  ASA: 2  Anesthesia Plan: General and Regional   Post-op Pain Management:  Regional for Post-op pain   Induction:   PONV Risk Score and Plan: 3 and Treatment may vary due to age or medical condition, Midazolam, Dexamethasone and Ondansetron  Airway Management Planned: LMA  Additional Equipment: None  Intra-op Plan:   Post-operative Plan: Extubation in OR  Informed Consent:   Plan Discussed with: CRNA and Anesthesiologist  Anesthesia Plan Comments:       Anesthesia Quick Evaluation

## 2021-09-17 NOTE — Transfer of Care (Signed)
Immediate Anesthesia Transfer of Care Note  Patient: Alexandra Huerta  Procedure(s) Performed: HALLUX VALGUS LAPIDUS, BONE GRAFT (Right)  Patient Location: PACU  Anesthesia Type:GA combined with regional for post-op pain  Level of Consciousness: awake, alert , oriented and patient cooperative  Airway & Oxygen Therapy: Patient Spontanous Breathing and Patient connected to face mask oxygen  Post-op Assessment: Report given to RN and Post -op Vital signs reviewed and stable  Post vital signs: Reviewed and stable  Last Vitals:  Vitals Value Taken Time  BP 118/82 09/17/21 1915  Temp    Pulse 79 09/17/21 1917  Resp 16 09/17/21 1917  SpO2 99 % 09/17/21 1917  Vitals shown include unvalidated device data.  Last Pain:  Vitals:   09/17/21 1324  TempSrc:   PainSc: 0-No pain         Complications: No notable events documented.

## 2021-09-17 NOTE — Anesthesia Procedure Notes (Signed)
Procedure Name: LMA Insertion Date/Time: 09/17/2021 4:37 PM Performed by: Minerva Ends, CRNA Pre-anesthesia Checklist: Patient identified, Emergency Drugs available, Suction available and Patient being monitored Patient Re-evaluated:Patient Re-evaluated prior to induction Oxygen Delivery Method: Circle System Utilized Preoxygenation: Pre-oxygenation with 100% oxygen Induction Type: IV induction Ventilation: Mask ventilation without difficulty LMA: LMA inserted LMA Size: 4.0 Number of attempts: 1 Placement Confirmation: positive ETCO2 Tube secured with: Tape Dental Injury: Teeth and Oropharynx as per pre-operative assessment  Comments: Smooth IV induction Brock- LMA insertion AM CRNA atraumatic- front teeth with chipping- pt did not report preop- bilat BS

## 2021-09-18 DIAGNOSIS — M8970 Major osseous defect, unspecified site: Secondary | ICD-10-CM

## 2021-09-18 DIAGNOSIS — M2011 Hallux valgus (acquired), right foot: Secondary | ICD-10-CM

## 2021-09-18 NOTE — Op Note (Signed)
Patient Name: Alexandra Huerta DOB: 08/10/1991  MRN: [id number]   Date of Service: 09/17/2021  Surgeon: Dr. Sharl Ma, DPM Assistants: None Pre-operative Diagnosis:  1) hallux valgus right foot  Post-operative Diagnosis:  1) hallux valgus right foot 2) major bone defect right foot Procedures:  1) correction bunionectomy Lapidus arthrodesis with lateral release right foot  2) bone graft minor from donor site right calcaneus Pathology/Specimens: * No specimens in log * Anesthesia: General anesthesia with regional block Hemostasis:  Total Tourniquet Time Documented: Calf (Right) - 111 minutes Total: Calf (Right) - 111 minutes  Estimated Blood Loss: 50 mL Materials:  Implant Name Type Inv. Item Serial No. Manufacturer Lot No. LRB No. Used Action  LAPIPLASTY SYSTEM 2 - [id number] Orthopedic Implant LAPIPLASTY SYSTEM 2  [facility] MEDICAL [id number] Right 1 Implanted  [employer] HARVESTING SYSTEM    [facility] MEDICAL [id number] Right 1 N/A  IMPL LAPIPLASTY LESSER TMT FIX - [id number] Orthopedic Implant IMPL LAPIPLASTY LESSER TMT FIX  [employer] [id number] Right 1 Implanted   Medications: None Complications: None  Indications for Procedure:  This is a 30 y.o. female with a history of hallux valgus deformity on the right foot that was very painful.  She elected for operative mention after failing nonsurgical treatment.  All risk benefits and potential complications were discussed prior to surgery and informed consent was signed and reviewed.   Procedure in Detail: Patient was identified in pre-operative holding area. Formal consent was signed and the right lower extremity was marked. Patient was brought back to the operating room. Anesthesia was induced. The extremity was prepped and draped in the usual sterile fashion. Timeout was taken to confirm patient name, laterality, and procedure prior to incision.   Attention was then directed to the right foot where a dorsal longitudinal  incision was made over the first tarsometatarsal joint.  This was placed medial to the extensor hallucis longus tendon.  Dissection was carried through subcutaneous tissues, ensuring that all vital neurovascular structures were protected throughout the procedure.  Bleeders were cauterized as necessary.  The deep fascia and periosteum was incised, dissection was carried to bone and the extensor hallucis longus tendon within its sheath and soft tissues were retracted laterally.  The periosteum was reflected in the first tarsometatarsal joint capsule and ligaments were incised and the joint was exposed.    A small incision was made in the first interspace. Under fluoroscopy, the lateral capsule and sesamoidal suspensory ligament were then released using a the speed release instrument through a small incision while a varus maneuver was made on the hallux with good release of the sesamoid complex.    A sagittal saw was then used to plane the first tarsometatarsal joint.  An osteotome was used to free plantar ligamentous attachments of the joint.  Once the joint mobilized the fulcrum was placed into the lateral base of the first metatarsal.  Dissection was carried plantar medial and the medial ridge of the first metatarsal was exposed.  The jig was then placed onto the ridge and the intermetatarsal angle was reduced with engagement of the windlass mechanism and varus rotation of the first metatarsal to correct the deformity.  This was done under fluoroscopy.  Once adequate correction was obtained a temporary fixation wire was placed through the jig.  2 dorsal Steinmann pins were then placed into the base of the first metatarsal and medial cuneiform.  The joint seeker and fulcrum were then placed into the joint once more and the cut guide was  placed over the Steinmann pins.  The base of the first metatarsal and distal cartilage and articular surface and subchondral bone of the medial cuneiform was then resected using a  sagittal saw through the cut guide.  The subchondral bone plate and cartilage was then removed.    The remaining bone was then fenestrated using a drill.  The bone resection left a osseous defect at the arthrodesis site.  I then directed my attention to the lateral heel where a 1 cm incision was made 2 cm anterior to the posterior border of the heel and 2 cm proximal to the glabrous junction.  A hemostat and freer elevator was used to elevate the soft tissues off the lateral wall the calcaneus.  The bone graft reamer was then used to procure 1 to 2 cc of autogenous cancellous bone graft.  This bone graft was taken and placed into the arthrodesis site of the first tarsometatarsal joint.  The calcaneal incision was then irrigated and closed with 3-0 nylon.    The compression jig was then placed over the Steinmann pins and the arthrodesis site was compressed under manual tension with the correction maintained with the reduction jig.  Once adequate compression and maintenance of correction of the deformity was noted under fluoroscopy it was then temporarily fixated using all of wires.  The compression jig was then removed and the plate was put into position.  This was then temporarily fixated and sequentially drilled and fixated using multiple locking screws.  That expose the medial surface of the arthrodesis site and placed the medial plate orthogonal to the dorsal plate.  All positions were checked with fluoroscopy.     Final films were then taken with a satisfactory result in correction of the deformity.  The wound was then thoroughly irrigated with normal sterile saline.  The incisions were then closed using 3-0 4-0 Monocryl and 3-0 nylon.   The foot was then dressed with Xeroform, 4 x 4 gauze, Kling, Kerlix, Ace wrap under light compression.  She was placed into a tall CAM boot. Patient tolerated the procedure well.  All counts were correct and operative procedure.  She was aroused from anesthesia and  transferred back to the recovery area in good condition    Disposition: Following a period of post-operative monitoring, patient will be transferred to home.

## 2021-09-19 NOTE — Anesthesia Postprocedure Evaluation (Signed)
Anesthesia Post Note  Patient: Alexandra Huerta  Procedure(s) Performed: HALLUX VALGUS LAPIDUS, BONE GRAFT (Right)     Patient location during evaluation: PACU Anesthesia Type: General Level of consciousness: awake and alert Pain management: pain level controlled Vital Signs Assessment: post-procedure vital signs reviewed and stable Respiratory status: spontaneous breathing, nonlabored ventilation and respiratory function stable Cardiovascular status: blood pressure returned to baseline and stable Postop Assessment: no apparent nausea or vomiting Anesthetic complications: no   No notable events documented.  Last Vitals:  Vitals:   09/17/21 1945 09/17/21 2000  BP: 117/82 113/81  Pulse: 81 79  Resp: 19 15  Temp:    SpO2: 95% 97%    Last Pain:  Vitals:   09/17/21 2000  TempSrc:   PainSc: 0-No pain                 Beryle Lathe

## 2021-09-21 ENCOUNTER — Ambulatory Visit: Payer: Medicaid Other

## 2021-09-23 ENCOUNTER — Other Ambulatory Visit: Payer: Self-pay

## 2021-09-23 ENCOUNTER — Ambulatory Visit (INDEPENDENT_AMBULATORY_CARE_PROVIDER_SITE_OTHER): Payer: Medicaid Other | Admitting: Podiatry

## 2021-09-23 ENCOUNTER — Ambulatory Visit (INDEPENDENT_AMBULATORY_CARE_PROVIDER_SITE_OTHER): Payer: Medicaid Other

## 2021-09-23 DIAGNOSIS — M21612 Bunion of left foot: Secondary | ICD-10-CM | POA: Diagnosis not present

## 2021-09-23 DIAGNOSIS — M21611 Bunion of right foot: Secondary | ICD-10-CM

## 2021-09-23 NOTE — Progress Notes (Signed)
  Subjective:  Patient ID: Alexandra Huerta, female    DOB: 10-30-1991,  MRN: [id number]  Chief Complaint  Patient presents with   Routine Post Op      POV #1 DOS 09/17/2021 LAPIDUS BUNIONECTOMY, POSS AIKEN OSTEOTOMY, BONE GRAFT FROM HEEL RT FOOT     30 y.o. female returns for post-op check.  Overall doing well the pain was worse the first night or 2 but has improved  Review of Systems: Negative except as noted in the HPI. Denies N/V/F/Ch.   Objective:  There were no vitals filed for this visit. There is no height or weight on file to calculate BMI. Constitutional Well developed. Well nourished.  Vascular Foot warm and well perfused. Capillary refill normal to all digits.   Neurologic Normal speech. Oriented to person, place, and time. Epicritic sensation to light touch grossly present bilaterally.  Dermatologic Skin healing well without signs of infection. Skin edges well coapted without signs of infection.  Orthopedic: Tenderness to palpation noted about the surgical site.  Moderate edema   Multiple view plain film radiographs: Status post Lapidus bunionectomy right foot hardware intact in good position and similar in appearance to immediate postop films Assessment:   1. Bilateral bunions    Plan:  Patient was evaluated and treated and all questions answered.  S/p foot surgery right -Progressing as expected post-operatively. -XR: As above -WB Status: NWB in CAM boot.  May be WB in 10 days -Sutures: Removed at next visit. -Medications: No refills required -Foot redressed.  May begin bathing Monday  Return in about 2 weeks (around 10/07/2021) for post op (no x-rays), suture removal.

## 2021-09-28 ENCOUNTER — Telehealth: Payer: Self-pay | Admitting: *Deleted

## 2021-09-28 ENCOUNTER — Ambulatory Visit: Payer: Medicaid Other

## 2021-09-28 MED ORDER — OXYCODONE HCL 5 MG PO TABS: 5.0000 mg | ORAL_TABLET | ORAL | 0 refills | Status: AC | PRN

## 2021-09-28 NOTE — Telephone Encounter (Signed)
Patient has been notified

## 2021-09-28 NOTE — Telephone Encounter (Signed)
Patient is calling to request a pain medicine refill, foot has been bothering her. Please advise.

## 2021-09-29 ENCOUNTER — Encounter (HOSPITAL_COMMUNITY): Payer: Self-pay | Admitting: Podiatry

## 2021-10-04 ENCOUNTER — Encounter (HOSPITAL_COMMUNITY): Payer: Self-pay | Admitting: Podiatry

## 2021-10-05 ENCOUNTER — Ambulatory Visit: Payer: Medicaid Other

## 2021-10-07 ENCOUNTER — Encounter: Payer: Self-pay | Admitting: Podiatry

## 2021-10-07 ENCOUNTER — Other Ambulatory Visit: Payer: Self-pay

## 2021-10-07 ENCOUNTER — Ambulatory Visit (INDEPENDENT_AMBULATORY_CARE_PROVIDER_SITE_OTHER): Payer: Medicaid Other | Admitting: Podiatry

## 2021-10-07 DIAGNOSIS — M21611 Bunion of right foot: Secondary | ICD-10-CM

## 2021-10-07 DIAGNOSIS — M21612 Bunion of left foot: Secondary | ICD-10-CM

## 2021-10-07 NOTE — Progress Notes (Signed)
  Subjective:  Patient ID: Alexandra Huerta, female    DOB: 1991/05/21,  MRN: [id number]  Chief Complaint  Patient presents with   Routine Post Op     POV #2 DOS 09/17/2021 LAPIDUS BUNIONECTOMY, POSS AIKEN OSTEOTOMY, BONE GRAFT FROM HEEL RT FOOT.     30 y.o. female returns for post-op check.  Improving says has good days and bad days, her leg feels weak with the calf is from not using it  Review of Systems: Negative except as noted in the HPI. Denies N/V/F/Ch.   Objective:  There were no vitals filed for this visit. There is no height or weight on file to calculate BMI. Constitutional Well developed. Well nourished.  Vascular Foot warm and well perfused. Capillary refill normal to all digits.  No calf pain  Neurologic Normal speech. Oriented to person, place, and time. Epicritic sensation to light touch grossly present bilaterally.  Dermatologic Skin healing well without signs of infection. Skin edges well coapted without signs of infection.  Orthopedic: Tenderness to palpation noted about the surgical site.  Moderate edema   Multiple view plain film radiographs: Status post Lapidus bunionectomy right foot hardware intact in good position and similar in appearance to immediate postop films Assessment:   1. Bilateral bunions    Plan:  Patient was evaluated and treated and all questions answered.  S/p foot surgery right -Progressing as expected post-operatively. -XR: As above -WB Status: WBAT in CAM boot -Sutures: Removed today -Medications: No refills required -Compression sleeve and toe spacer supplied and she will wear this  Return in about 3 weeks (around 10/28/2021) for post op (new x-rays).

## 2021-10-12 ENCOUNTER — Other Ambulatory Visit: Payer: Self-pay

## 2021-10-12 ENCOUNTER — Ambulatory Visit: Payer: Medicaid Other | Attending: Family Medicine

## 2021-10-12 DIAGNOSIS — M545 Low back pain, unspecified: Secondary | ICD-10-CM | POA: Insufficient documentation

## 2021-10-12 DIAGNOSIS — R293 Abnormal posture: Secondary | ICD-10-CM | POA: Insufficient documentation

## 2021-10-12 DIAGNOSIS — G8929 Other chronic pain: Secondary | ICD-10-CM | POA: Insufficient documentation

## 2021-10-12 DIAGNOSIS — M6281 Muscle weakness (generalized): Secondary | ICD-10-CM | POA: Diagnosis present

## 2021-10-12 NOTE — Therapy (Addendum)
[facility] [address] [place], [place], [address] Phone: [phone]   Fax:  [phone]  Physical Therapy Treatment/Discharge  Patient Details  Name: Alexandra Huerta MRN: [id number] Date of Birth: 01/19/1991 Referring Provider (PT): Robinson, Carmen T, MD   Encounter Date: 10/12/2021   PT End of Session - 10/12/21 1058     Visit Number 3    Number of Visits 9    Date for PT Re-Evaluation 10/20/21    Authorization Type [employer]    Authorization Time Period 10/27-12/24/22    Authorization - Visit Number 2    Authorization - Number of Visits 8    PT Start Time 0945   session limited due to patient being late   PT Stop Time 1015    PT Time Calculation (min) 30 min    Equipment Utilized During Treatment --   CAM boot   Activity Tolerance Patient tolerated treatment well    Behavior During Therapy WFL for tasks assessed/performed             Past Medical History:  Diagnosis Date   Anemia    Headache     Past Surgical History:  Procedure Laterality Date   CESAREAN SECTION  2010, 2016, 2017   HALLUX VALGUS LAPIDUS Right 09/17/2021   Procedure: HALLUX VALGUS LAPIDUS, BONE GRAFT;  Surgeon: McDonald, Adam R, DPM;  Location: [facility];  Service: Podiatry;  Laterality: Right;  WITH BLOCK    There were no vitals filed for this visit.   Subjective Assessment - 10/12/21 0946     Subjective Pt says she hasnt had much back pain since her foot surgery, but that she has also been taking more pain medication which she attributes to helping her back pain. Pt says she can sit for an hour without back pain.    How long can you sit comfortably? --    How long can you stand comfortably? --    How long can you walk comfortably? --    Diagnostic tests n/a    Patient Stated Goals "Hopefully my back will stop hurting"    Currently in Pain? No/denies            [facility] Adult PT Treatment/Exercise:   Therapeutic Exercise: -  nu-step L8 4m while taking subjective and planning session with patient - deadbugs UE and LE alternating, discontinued due to pt inability to correct form  - deadbugs holds LE alternating 3x15 - PPT 10secs x10  - PPT with marches 1x15  - bridges with ball 1x15  - sidelying hip abduction 2x15 bilat  - palof press yellow band 2x15 bilat  - Updated HEP  *not performed today  - Manual hip flexor stretch - (modified thomas with RF) - 1' ea (R tighter than L) - Alternating clamshell - GTB - 2x20 - PPT 5'' x10 - hip adduction ball squeeze - 5'' hold - 2x10 - LTR - 20x - Alternating SLR from foam roller - 2x10 - Bridge - 5'' 2x10 - abdominal swiss ball squeeze - 10x 5'' hold    [facility] PT Assessment - 10/12/21 0001       AROM   Lumbar Flexion WNL, increased back pain    Lumbar Extension WNL, increased back pain      Strength   Right Hip Extension 4/5    Right Hip ABduction 4+/5    Left Hip Extension 4/5    Left Hip ABduction 4+/5                                        PT Short Term Goals - 10/12/21 1540       PT SHORT TERM GOAL #1   Title Patient will be independent with initial HEP.    Baseline issued at eval    Status Achieved    Target Date 09/15/21      PT SHORT TERM GOAL #2   Title Patient will tolerate at least 15 minutes of seated activity.    Baseline 5 minutes    Status Achieved    Target Date 09/22/21               PT Long Term Goals - 08/25/21 1408       PT LONG TERM GOAL #1   Title Patient will be able to maintain plank with proper form without increased back pain for at least 20 seconds indicative of improved lumbopelvic stability.    Baseline 5 seconds    Status New    Target Date 10/20/21      PT LONG TERM GOAL #2   Title Patient will demonstrate 5/5 bilateral hip strength to improve stability about the chain with prolonged standing and walking activity.    Baseline see flowsheet    Status New    Target Date 10/20/21       PT LONG TERM GOAL #3   Title Patient will score less than 25% disability on the Oswestry Low Back Disability Index to signify clinically meaningful improvement in functional abilities.    Baseline see flowsheet    Status New    Target Date 10/20/21      PT LONG TERM GOAL #4   Title Patient will be independent with advanced home program to assist in management of her chronic condition.    Status New    Target Date 10/20/21                   Plan - 10/12/21 1146     Clinical Impression Statement Pt able to achieve all short term goals and is continueing to progress well in therapy. Pt continues to have LBP with lumbar flexion and extension AROM.  Pt tolerated todays treatment session well which focused on abdominal activation and control throughout dynamic movements. Heavy verbal and tactile cues required for pelvic control and abdominal activation throughout exercises. Pt reports no increase in pain at end of session.    Personal Factors and Comorbidities Time since onset of injury/illness/exacerbation;Fitness    Examination-Activity Limitations Sit;Stand;Locomotion Level    Examination-Participation Restrictions Meal Prep    Stability/Clinical Decision Making --    Rehab Potential --    PT Frequency --    PT Duration --    PT Treatment/Interventions ADLs/Self Care Home Management;Cryotherapy;Electrical Stimulation;Moist Heat;Therapeutic activities;Therapeutic exercise;Balance training;Neuromuscular re-education;Patient/family education;Manual techniques;Dry needling;Taping    PT Next Visit Plan Progress HEP and core activation exercises.    PT Home Exercise Plan Access Code: [email or web address]    Consulted and Agree with Plan of Care Patient             Patient will benefit from skilled therapeutic intervention in order to improve the following deficits and impairments:  Difficulty walking, Hypermobility, Pain, Improper body mechanics, Impaired flexibility, Decreased strength,  Postural dysfunction  Visit Diagnosis: Chronic midline low back pain, unspecified whether sciatica present  Muscle weakness (generalized)  Abnormal posture     Problem List Patient Active Problem List   Diagnosis Date Noted   Major osseous defects    Hallux valgus (acquired), right foot    Migraine with aura   and with status migrainosus, not intractable 01/03/2019    Anna Blackley, SPT 10/12/21 4:00 PM  PHYSICAL THERAPY DISCHARGE SUMMARY  Visits from Start of Care: 3  Current functional level related to goals / functional outcomes: See goals above   Remaining deficits: Status unknown   Education / Equipment: N/a   Patient agrees to discharge. Patient goals were partially met. Patient is being discharged due to not returning since the last visit.  Samantha Pexa, PT, DPT, ATC 12/06/21 4:11 PM  [facility] [address] [place], [place], [address] Phone: [phone]   Fax:  [phone]  Name: Andera Yazdi MRN: [id number] Date of Birth: 03/20/1991    

## 2021-10-19 ENCOUNTER — Ambulatory Visit: Payer: Medicaid Other

## 2021-10-19 ENCOUNTER — Telehealth: Payer: Self-pay

## 2021-10-19 NOTE — Telephone Encounter (Signed)
Call no answer, left voicemail. Explained attendance policy.

## 2021-10-28 ENCOUNTER — Other Ambulatory Visit: Payer: Self-pay

## 2021-10-28 ENCOUNTER — Encounter: Payer: Self-pay | Admitting: Podiatry

## 2021-10-28 ENCOUNTER — Ambulatory Visit (INDEPENDENT_AMBULATORY_CARE_PROVIDER_SITE_OTHER): Payer: Medicaid Other | Admitting: Podiatry

## 2021-10-28 ENCOUNTER — Ambulatory Visit (INDEPENDENT_AMBULATORY_CARE_PROVIDER_SITE_OTHER): Payer: Medicaid Other

## 2021-10-28 DIAGNOSIS — M21612 Bunion of left foot: Secondary | ICD-10-CM | POA: Diagnosis not present

## 2021-10-28 DIAGNOSIS — M21611 Bunion of right foot: Secondary | ICD-10-CM | POA: Diagnosis not present

## 2021-10-28 NOTE — Progress Notes (Signed)
°  Subjective:  Patient ID: Brand Males, female    DOB: July 05, 1991,  MRN: [id number]  Chief Complaint  Patient presents with   Routine Post Op    POV #3 DOS 09/17/2021 LAPIDUS BUNIONECTOMY, POSS AIKEN OSTEOTOMY, BONE GRAFT FROM HEEL RT FOOT     30 y.o. female returns for post-op check.  Still having slight pain and swelling but improving  Review of Systems: Negative except as noted in the HPI. Denies N/V/F/Ch.   Objective:  There were no vitals filed for this visit. There is no height or weight on file to calculate BMI. Constitutional Well developed. Well nourished.  Vascular Foot warm and well perfused. Capillary refill normal to all digits.  No calf pain  Neurologic Normal speech. Oriented to person, place, and time. Epicritic sensation to light touch grossly present bilaterally.  Dermatologic Skin healing well without signs of infection. Skin edges well coapted without signs of infection.  Orthopedic: Tenderness to palpation noted about the surgical site.  Moderate edema   Multiple view plain film radiographs: Good consolidation across the arthrodesis site Assessment:   1. Bilateral bunions    Plan:  Patient was evaluated and treated and all questions answered.  S/p foot surgery right -Progressing as expected post-operatively. -XR: As above -WB Status: Transition to regular supportive shoe gear which I discussed with her.  If swelling and pain is persistent and continues to worsen then advised to rest and stay off the foot and may need to wear the boot for longer distances -Sutures: Removed previously -Medications: No refills required -Compression sleeve and toe spacer supplied and she will wear this  Return in about 6 weeks (around 12/09/2021) for post op (new x-rays).

## 2021-12-14 ENCOUNTER — Ambulatory Visit (INDEPENDENT_AMBULATORY_CARE_PROVIDER_SITE_OTHER): Payer: Medicaid Other | Admitting: Podiatry

## 2021-12-14 ENCOUNTER — Other Ambulatory Visit: Payer: Self-pay

## 2021-12-14 ENCOUNTER — Ambulatory Visit (INDEPENDENT_AMBULATORY_CARE_PROVIDER_SITE_OTHER): Payer: Medicaid Other

## 2021-12-14 DIAGNOSIS — M21612 Bunion of left foot: Secondary | ICD-10-CM

## 2021-12-14 DIAGNOSIS — Z9889 Other specified postprocedural states: Secondary | ICD-10-CM

## 2021-12-14 DIAGNOSIS — M21611 Bunion of right foot: Secondary | ICD-10-CM

## 2021-12-15 NOTE — Progress Notes (Signed)
°  Subjective:  Patient ID: Alexandra Huerta, female    DOB: 04-Sep-1991,  MRN: [id number]  Chief Complaint  Patient presents with   Routine Post Op     POV #4 DOS 09/17/2021 LAPIDUS BUNIONECTOMY, POSS AIKEN OSTEOTOMY, BONE GRAFT FROM HEEL RT FOOT     31 y.o. female returns for post-op check.  Overall doing well she has some sensitivity around the scar.  Review of Systems: Negative except as noted in the HPI. Denies N/V/F/Ch.   Objective:  There were no vitals filed for this visit. There is no height or weight on file to calculate BMI. Constitutional Well developed. Well nourished.  Vascular Foot warm and well perfused. Capillary refill normal to all digits.  No calf pain  Neurologic Normal speech. Oriented to person, place, and time. Epicritic sensation to light touch grossly present bilaterally.  Dermatologic Skin healing well without signs of infection. Skin edges well coapted without signs of infection.  Orthopedic: Minimal edema.  She has some sensitivity around the medial side of the incision   Multiple view plain film radiographs: Good consolidation across the arthrodesis site no change in hardware position or alignment Assessment:   1. Bilateral bunions   2. Post-operative state    Plan:  Patient was evaluated and treated and all questions answered.  S/p foot surgery right -Overall doing very well back to full activity and shoe gear.  I have no further restrictions for her at this point.  She does have an area of sensitivity on the medial side of the incision we discussed the possibility of an injection or topical treatment lidocaine cream at this area.  She will see how this does and let me know if she has any further issues.  Left foot is not currently bothersome  Return if symptoms worsen or fail to improve.

## 2022-11-03 ENCOUNTER — Other Ambulatory Visit: Payer: Self-pay | Admitting: Family Medicine

## 2022-11-03 ENCOUNTER — Ambulatory Visit (INDEPENDENT_AMBULATORY_CARE_PROVIDER_SITE_OTHER): Payer: Medicaid Other

## 2022-11-03 ENCOUNTER — Ambulatory Visit
Admission: EM | Admit: 2022-11-03 | Discharge: 2022-11-03 | Disposition: A | Discharge status: Home / Self Care | Payer: Medicaid Other | Attending: Family Medicine | Admitting: Family Medicine

## 2022-11-03 DIAGNOSIS — N6310 Unspecified lump in the right breast, unspecified quadrant: Secondary | ICD-10-CM

## 2022-11-03 DIAGNOSIS — R051 Acute cough: Secondary | ICD-10-CM | POA: Diagnosis not present

## 2022-11-03 DIAGNOSIS — R0782 Intercostal pain: Secondary | ICD-10-CM | POA: Diagnosis not present

## 2022-11-03 DIAGNOSIS — R0781 Pleurodynia: Secondary | ICD-10-CM

## 2022-11-03 MED ORDER — BENZONATATE 100 MG PO CAPS: 100.0000 mg | ORAL_CAPSULE | Freq: Three times a day (TID) | ORAL | 0 refills | Status: DC | PRN

## 2022-11-03 MED ORDER — IBUPROFEN 800 MG PO TABS: 800.0000 mg | ORAL_TABLET | Freq: Three times a day (TID) | ORAL | 0 refills | Status: DC | PRN

## 2022-11-03 NOTE — Discharge Instructions (Signed)
The chest x-ray did not show any problem  Take benzonatate 100 mg, 1 tab every 8 hours as needed for cough.  Take ibuprofen 800 mg--1 tab every 8 hours as needed for pain.  Call [facility] imaging to set up your breast imaging.

## 2022-11-03 NOTE — ED Provider Notes (Signed)
[name]    CSN: [id number] Arrival date & time: 11/03/22  1311      History   Chief Complaint Chief Complaint  Patient presents with   Abdominal Pain    Left side of rib hurting, very painful when coughing, sneezing, laughing and sitting up or laying back. Feel lump in right breast. - Entered by patient    HPI Alexandra Huerta is a 31 y.o. female.    Abdominal Pain  For left lower rib pain.  Is been bothering her for about 3 days.  It bothers her when she is breathing deeply or coughing or sneezing.  Also bothers her some when she lies back.  No fever, but she has had cough for a couple of weeks.  She also has noted a sore right lump in her right breast.  She has noted that for a couple of weeks.  Last menstrual cycle is finishing right now  Past Medical History:  Diagnosis Date   Anemia    Headache     Patient Active Problem List   Diagnosis Date Noted   Major osseous defects    Hallux valgus (acquired), right foot    Migraine with aura and with status migrainosus, not intractable 01/03/2019    Past Surgical History:  Procedure Laterality Date   CESAREAN SECTION  2010, 2016, 2017   HALLUX VALGUS LAPIDUS Right 09/17/2021   Procedure: HALLUX VALGUS LAPIDUS, BONE GRAFT;  Surgeon: Edwin Cap, DPM;  Location: [facility];  Service: Podiatry;  Laterality: Right;  WITH BLOCK    OB History   No obstetric history on file.      Home Medications    Prior to Admission medications   Medication Sig Start Date End Date Taking? Authorizing Provider  benzonatate (TESSALON) 100 MG capsule Take 1 capsule (100 mg total) by mouth 3 (three) times daily as needed for cough. 11/03/22  Yes Zenia Resides, MD  ibuprofen (ADVIL) 800 MG tablet Take 1 tablet (800 mg total) by mouth every 8 (eight) hours as needed (pain). 11/03/22  Yes Zenia Resides, MD  fluticasone (FLONASE) 50 MCG/ACT nasal spray Place 1-2 sprays into both nostrils daily as needed for  allergies. 08/02/21   [provider]  gabapentin (NEURONTIN) 300 MG capsule Take 1 capsule (300 mg total) by mouth 3 (three) times daily for 7 days. 09/17/21 09/24/21  McDonald, Rachelle Hora, DPM  levocetirizine (XYZAL) 5 MG tablet Take 5 mg by mouth at bedtime. 08/02/21   [provider]  montelukast (SINGULAIR) 10 MG tablet Take 10 mg by mouth every evening. 08/02/21   [provider]  pantoprazole (PROTONIX) 40 MG tablet Take 40 mg by mouth daily. 09/14/21   [provider]    Family History Family History  Problem Relation Age of Onset   Lupus Mother     Social History Social History   Tobacco Use   Smoking status: Former    Packs/day: 0.50    Types: Cigarettes    Quit date: 12/2020    Years since quitting: 1.9   Smokeless tobacco: Never   Tobacco comments:    09/05/18 5 cigs daily  Vaping Use   Vaping Use: Some days   Substances: Nicotine, Flavoring  Substance Use Topics   Alcohol use: Not Currently   Drug use: Never     Allergies   Clindamycin/lincomycin, Penicillins, and Latex   Review of Systems Review of Systems  Gastrointestinal:  Positive for abdominal pain.  Physical Exam Triage Vital Signs ED Triage Vitals  Enc Vitals Group     BP 11/03/22 1449 126/87     Pulse Rate 11/03/22 1449 69     Resp 11/03/22 1449 18     Temp 11/03/22 1449 97.6 F (36.4 C)     Temp Source 11/03/22 1449 Oral     SpO2 11/03/22 1449 99 %     Weight --      Height --      Head Circumference --      Peak Flow --      Pain Score 11/03/22 1315 5     Pain Loc --      Pain Edu? --      Excl. in GC? --    No data found.  Updated Vital Signs BP 126/87 (BP Location: Right Arm)   Pulse 69   Temp 97.6 F (36.4 C) (Oral)   Resp 18   SpO2 99%   Visual Acuity Right Eye Distance:   Left Eye Distance:   Bilateral Distance:    Right Eye Near:   Left Eye Near:    Bilateral Near:     Physical Exam Vitals reviewed.  Constitutional:       General: She is not in acute distress.    Appearance: She is not ill-appearing, toxic-appearing or diaphoretic.  HENT:     Mouth/Throat:     Mouth: Mucous membranes are moist.  Eyes:     Extraocular Movements: Extraocular movements intact.     Pupils: Pupils are equal, round, and reactive to light.  Cardiovascular:     Rate and Rhythm: Normal rate and regular rhythm.     Heart sounds: No murmur heard. Pulmonary:     Effort: Pulmonary effort is normal. No respiratory distress.     Breath sounds: No stridor. No wheezing, rhonchi or rales.  Chest:     Chest wall: Tenderness (left lower rib cage in anterior axillary line) present.     Comments: There is a subcu nodule about 1 cm in diameter on the right outer breast.  No overlying skin changes or erythema.  It is mildly tender and fairly rubbery Musculoskeletal:     Cervical back: Neck supple.  Lymphadenopathy:     Cervical: No cervical adenopathy.  Skin:    Capillary Refill: Capillary refill takes less than 2 seconds.     Coloration: Skin is not jaundiced or pale.     Findings: No rash.  Neurological:     General: No focal deficit present.     Mental Status: She is alert and oriented to person, place, and time.  Psychiatric:        Behavior: Behavior normal.      UC Treatments / Results  Labs (all labs ordered are listed, but only abnormal results are displayed) Labs Reviewed - No data to display  EKG   Radiology DG Ribs Unilateral W/Chest Left  Result Date: 11/03/2022 CLINICAL DATA:  LEFT LOWER rib pain. EXAM: LEFT RIBS AND CHEST - 3+ VIEW COMPARISON:  09/09/2021 FINDINGS: Heart size is normal. The lungs are free of focal consolidations and pleural effusions. Oblique views demonstrate normal appearance of the ribs. No acute displaced rib fracture. IMPRESSION: No evidence for acute cardiopulmonary disease. No acute displaced rib fracture. Electronically Signed   By: Norva Pavlov M.D.   On: 11/03/2022 15:30     Procedures Procedures (including critical care time)  Medications Ordered in UC Medications - No data to display  Initial Impression / Assessment and Plan / UC Course  I have reviewed the triage vital signs and the nursing notes.  Pertinent labs & imaging results that were available during my care of the patient were reviewed by me and considered in my medical decision making (see chart for details).        Chest x-ray and rib detail is negative.  Mammogram and ultrasound are ordered to evaluate the lump in her right breast.  Tessalon Perles and ibuprofen are sent in for symptom relief. Final Clinical Impressions(s) / UC Diagnoses   Final diagnoses:  Rib pain on left side  Acute cough  Mass of right breast, unspecified quadrant     Discharge Instructions      The chest x-ray did not show any problem  Take benzonatate 100 mg, 1 tab every 8 hours as needed for cough.  Take ibuprofen 800 mg--1 tab every 8 hours as needed for pain.  Call [facility] imaging to set up your breast imaging.     ED Prescriptions     Medication Sig Dispense Auth. Provider   ibuprofen (ADVIL) 800 MG tablet Take 1 tablet (800 mg total) by mouth every 8 (eight) hours as needed (pain). 21 tablet Josiah Nieto, Janace Aris, MD   benzonatate (TESSALON) 100 MG capsule Take 1 capsule (100 mg total) by mouth 3 (three) times daily as needed for cough. 21 capsule Zenia Resides, MD      PDMP not reviewed this encounter.   Zenia Resides, MD 11/03/22 1539

## 2022-11-03 NOTE — ED Triage Notes (Signed)
Pt presents with left side abdominal pain X 3 days.

## 2023-01-04 ENCOUNTER — Ambulatory Visit
Admission: RE | Admit: 2023-01-04 | Discharge: 2023-01-04 | Disposition: A | Discharge status: Home / Self Care | Payer: Medicaid Other | Source: Ambulatory Visit | Attending: Family Medicine | Admitting: Family Medicine

## 2023-01-04 ENCOUNTER — Other Ambulatory Visit: Payer: Self-pay | Admitting: Family Medicine

## 2023-01-04 DIAGNOSIS — N6310 Unspecified lump in the right breast, unspecified quadrant: Secondary | ICD-10-CM

## 2023-01-04 DIAGNOSIS — R921 Mammographic calcification found on diagnostic imaging of breast: Secondary | ICD-10-CM

## 2023-01-12 ENCOUNTER — Ambulatory Visit
Admission: RE | Admit: 2023-01-12 | Discharge: 2023-01-12 | Disposition: A | Discharge status: Home / Self Care | Payer: Medicaid Other | Source: Ambulatory Visit | Attending: Family Medicine | Admitting: Family Medicine

## 2023-01-12 DIAGNOSIS — R921 Mammographic calcification found on diagnostic imaging of breast: Secondary | ICD-10-CM

## 2023-01-12 HISTORY — PX: BREAST BIOPSY: SHX20

## 2023-02-25 IMAGING — CR DG CHEST 2V
2 series · 2 of 2 positions shown · non-contrast
Comparison: None.

CLINICAL DATA: Reason for exam: Chest pain Per ED Triage Note: "Pt
was sent here from [REDACTED]. Reports wisdom teeth removed [REDACTED], began
having facial swelling and mid chest pain yesterday. Pain increases
with movement. Denies sob, no resp distress is noted."cp

EXAM:
CHEST - 2 VIEW

[chest pa]
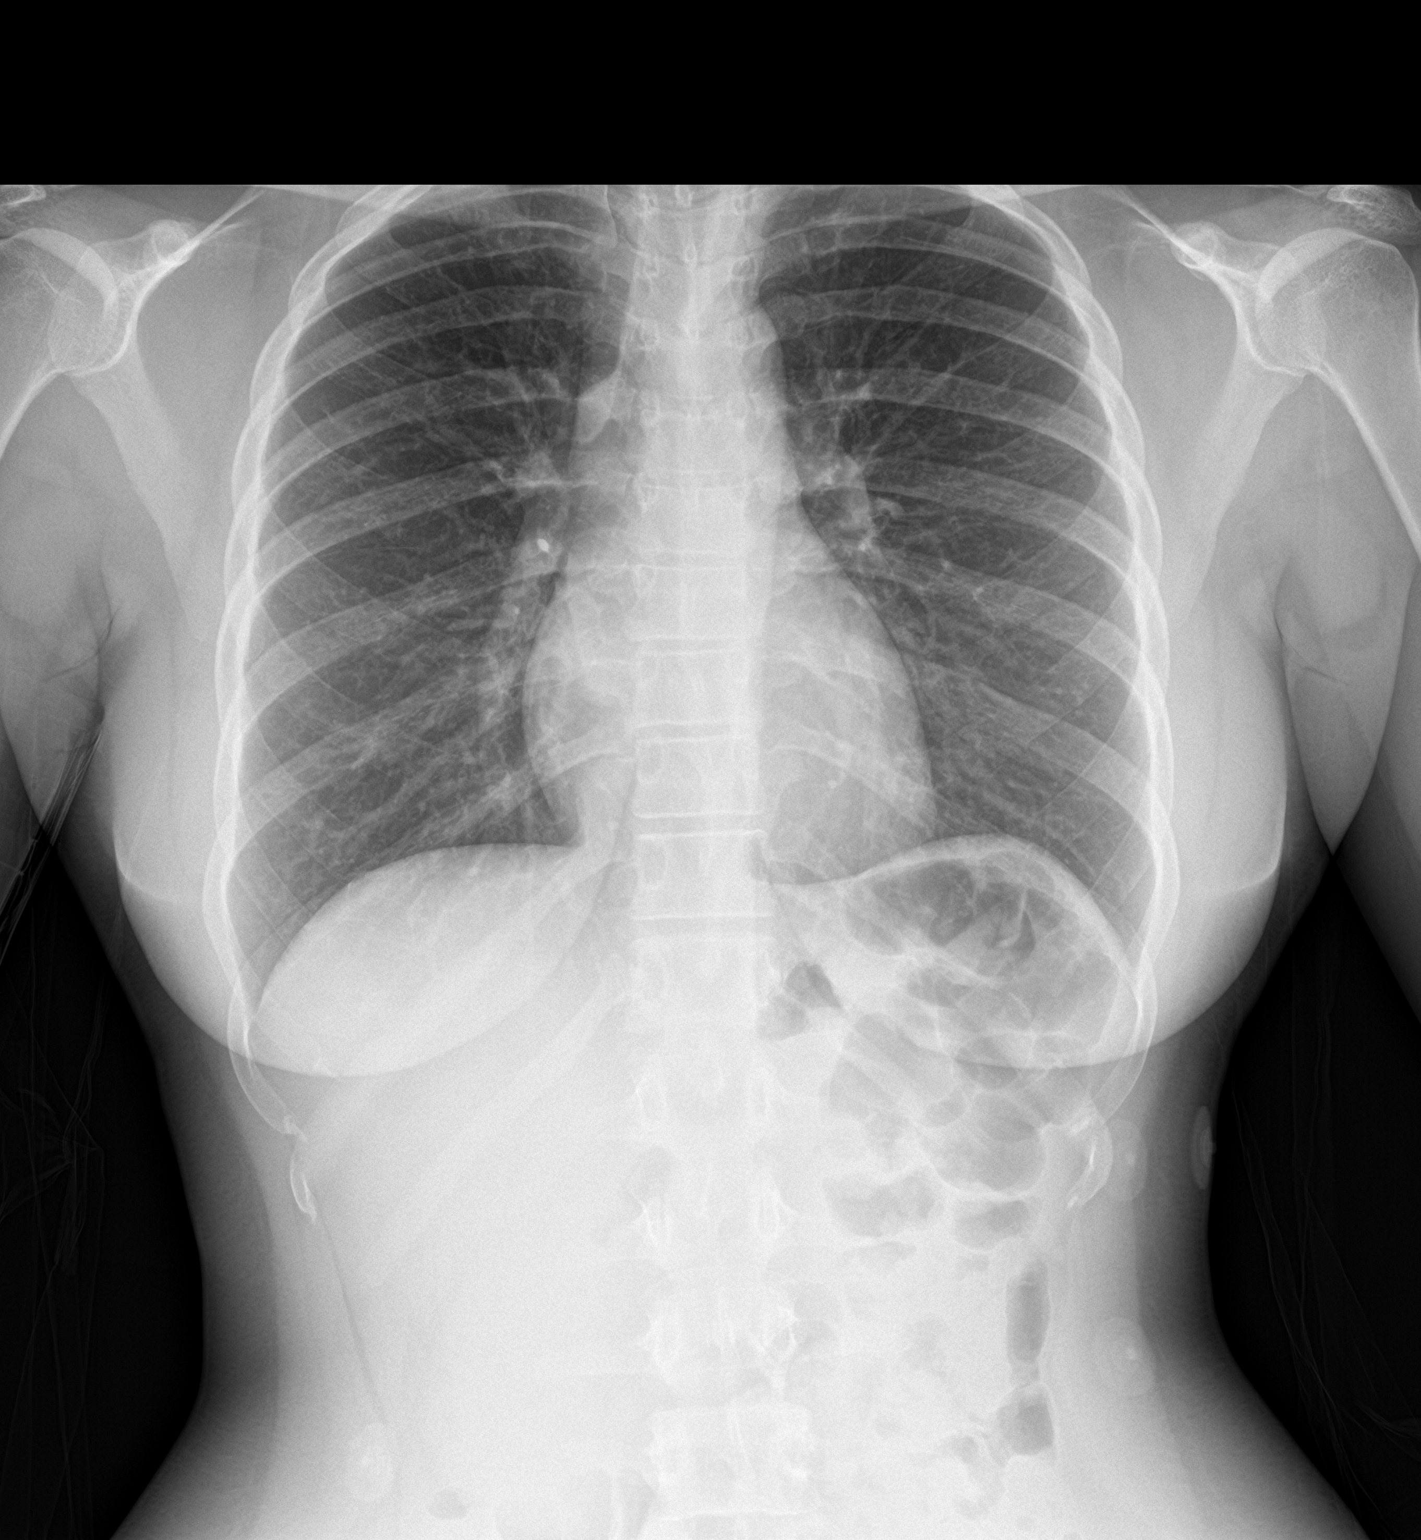

[chest lat]
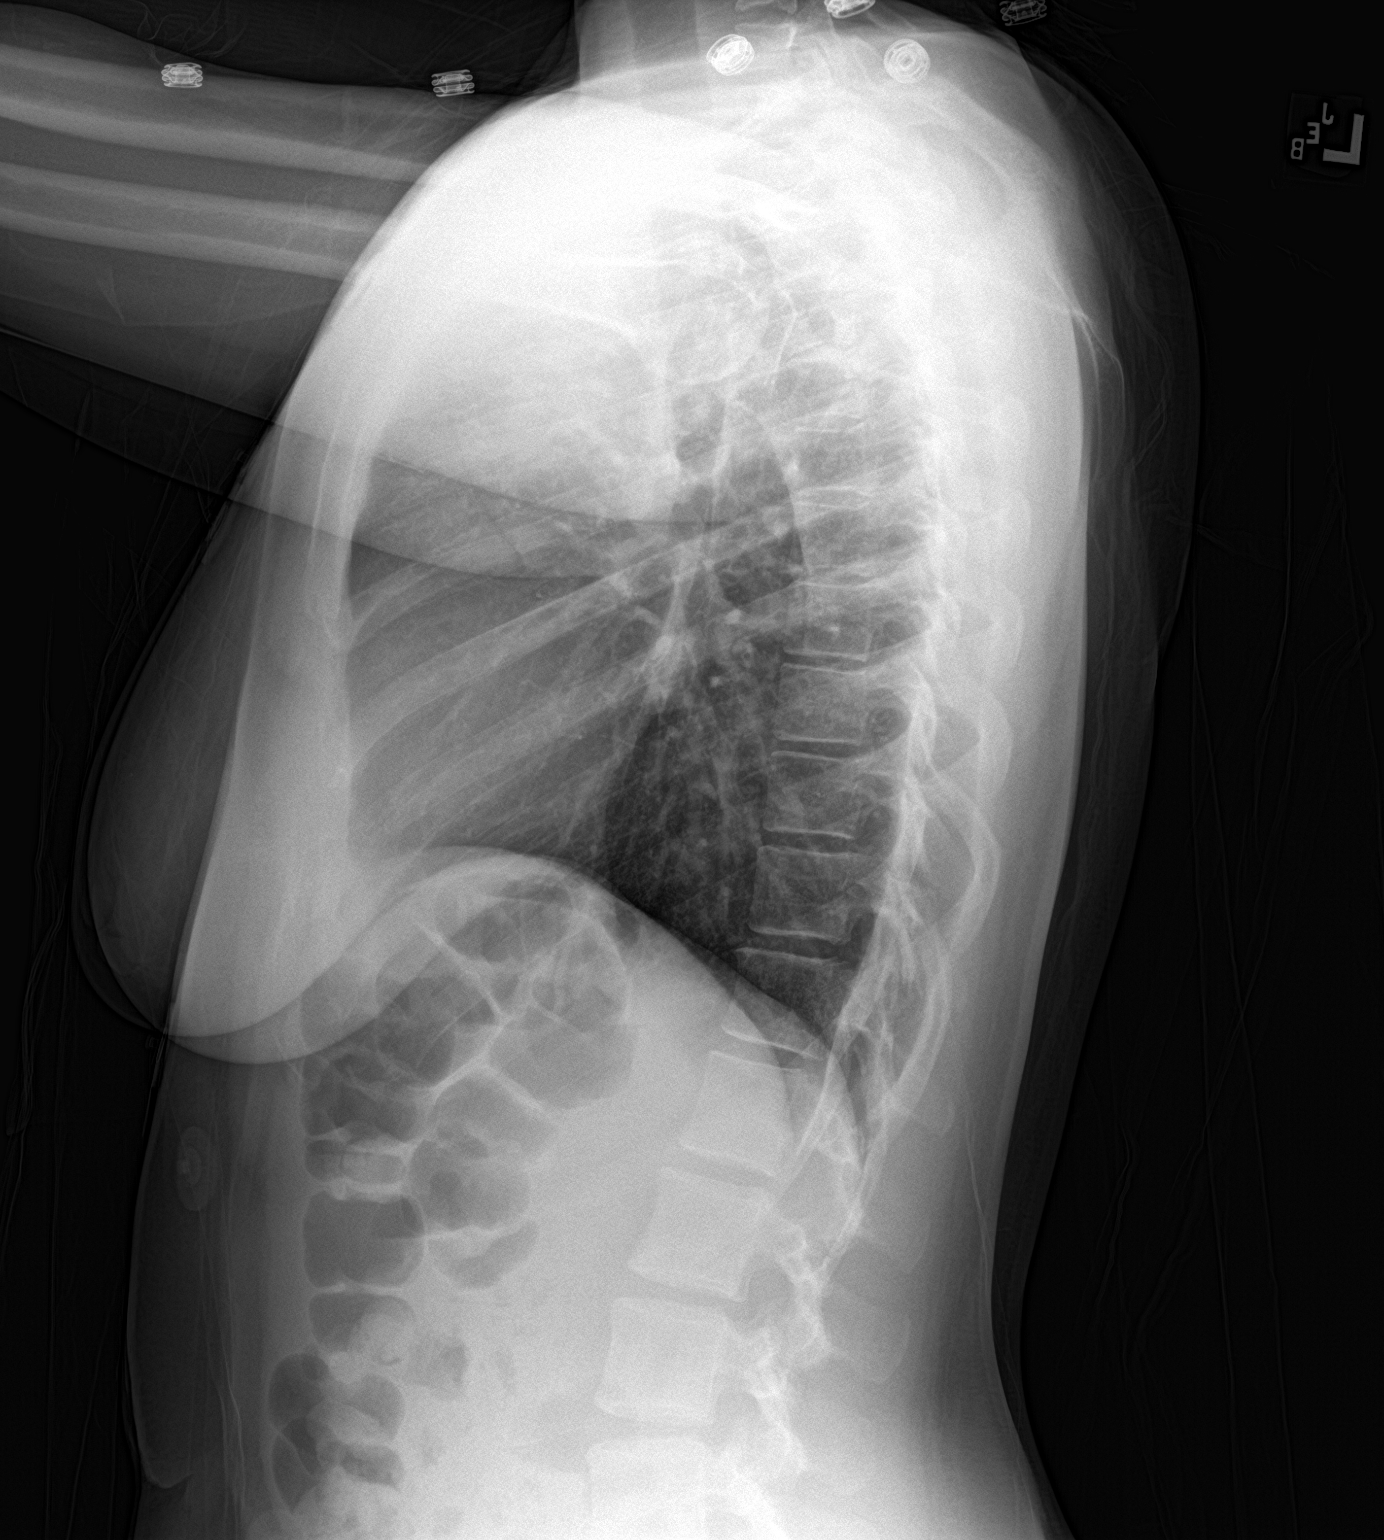

[2 of 2 positions shown; findings below may reference images not displayed]

FINDINGS: Normal mediastinum and cardiac silhouette. Normal pulmonary
vasculature. No evidence of effusion, infiltrate, or pneumothorax.
No acute bony abnormality.
IMPRESSION: Normal chest radiograph.

## 2023-05-30 ENCOUNTER — Other Ambulatory Visit: Payer: Self-pay | Admitting: Surgery

## 2023-05-30 DIAGNOSIS — R928 Other abnormal and inconclusive findings on diagnostic imaging of breast: Secondary | ICD-10-CM

## 2023-06-02 ENCOUNTER — Other Ambulatory Visit: Payer: Self-pay | Admitting: Surgery

## 2023-06-02 DIAGNOSIS — R928 Other abnormal and inconclusive findings on diagnostic imaging of breast: Secondary | ICD-10-CM

## 2023-06-15 ENCOUNTER — Other Ambulatory Visit: Payer: Self-pay

## 2023-06-15 ENCOUNTER — Encounter (HOSPITAL_BASED_OUTPATIENT_CLINIC_OR_DEPARTMENT_OTHER): Payer: Self-pay | Admitting: Surgery

## 2023-06-19 ENCOUNTER — Ambulatory Visit (INDEPENDENT_AMBULATORY_CARE_PROVIDER_SITE_OTHER): Payer: 59 | Admitting: Podiatry

## 2023-06-19 DIAGNOSIS — M21612 Bunion of left foot: Secondary | ICD-10-CM | POA: Diagnosis not present

## 2023-06-19 DIAGNOSIS — M2012 Hallux valgus (acquired), left foot: Secondary | ICD-10-CM | POA: Diagnosis not present

## 2023-06-19 DIAGNOSIS — M79672 Pain in left foot: Secondary | ICD-10-CM | POA: Diagnosis not present

## 2023-06-19 NOTE — Progress Notes (Signed)
  Subjective:  Patient ID: Alexandra Huerta, female    DOB: [birth year],  MRN: [id number]  Chief Complaint  Patient presents with   Foot Pain    Bunion on the left great toe. Pt stated that the bunion hurts especially when it gets cold.    32 y.o. female presents with the above complaint. History confirmed with patient.  Her right side is doing quite well from her bunionectomy with me.  The left side is now painful and she is interested in proceeding with surgery on this side.  The left foot bunion is painful in shoe gear and hurt on the bumps.  No pain when barefoot or in open toed shoes.  She has been trying anti-inflammatories which have not been helpful.  Has to wear closed toed shoes for her job  Objective:  Physical Exam: warm, good capillary refill, no trophic changes or ulcerative lesions, normal DP and PT pulses, and normal sensory exam.  Significant hallux valgus with hypermobility at the first ray on the left, pain on the medial eminence, good range of motion of the joint that is pain-free, right side well-healed surgical incisions with decreased hypermobility, good range of motion of MTPJ   Radiographs: Multiple views x-ray of both feet: Moderate to severe hallux valgus with frontal plane rotation of the first ray on the sesamoid axial view Assessment:   1. Hallux valgus with bunions, left   2. Left foot pain       Plan:  Patient was evaluated and treated and all questions answered.  Today we again discussed the etiology and treatment including surgical and non surgical treatment for painful bunions.  She has exhausted all non surgical treatment prior to this visit including shoe gear changes and padding.  She previously did quite well with Lapidus bunionectomy on the right foot.  For the left foot she desires surgical intervention. We discussed all risks including but not limited to: pain, swelling, infection, scar, numbness which may be temporary or permanent, chronic pain,  stiffness, nerve pain or damage, wound healing problems, bone healing problems including delayed or non-union and recurrence. Specifically we discussed the following procedures: Lapidus bunionectomy with possible Akin osteotomy and autograft from calcaneus on her left foot. Informed consent was signed today. Surgery will be scheduled at a mutually agreeable date. Information regarding this will be forwarded to our surgery scheduler.  In the interim prior to surgery on both feet I advised her to use as wider shoes as possible, bunion pad spacers and utilize ice and anti-inflammatories as needed for the pain such as Motrin or Aleve OTC.   Surgical plan:  Procedure: -Lapidus bunionectomy possible Akin and calcaneal autograft left foot  Location: -[facility]  Anesthesia plan: -IV sedation with regional block  Postoperative pain plan: - Tylenol 1000 mg every 6 hours, ibuprofen 600 mg every 6 hours, gabapentin 300 mg every 8 hours x5 days, oxycodone 5 mg 1-2 tabs every 6 hours only as needed  DVT prophylaxis: -None required  WB Restrictions / DME needs: -NWB in CAM boot with crutches, she did well with this last time and was able to maintain nonweightbearing as directed   No follow-ups on file.

## 2023-06-21 ENCOUNTER — Ambulatory Visit
Admission: RE | Admit: 2023-06-21 | Discharge: 2023-06-21 | Disposition: A | Discharge status: Home / Self Care | Payer: 59 | Source: Ambulatory Visit | Attending: Surgery | Admitting: Surgery

## 2023-06-21 DIAGNOSIS — R928 Other abnormal and inconclusive findings on diagnostic imaging of breast: Secondary | ICD-10-CM

## 2023-06-21 HISTORY — PX: BREAST BIOPSY: SHX20

## 2023-06-21 MED ORDER — ENSURE PRE-SURGERY PO LIQD: 296.0000 mL | Freq: Once | ORAL | Status: DC

## 2023-06-21 MED ORDER — CHLORHEXIDINE GLUCONATE CLOTH 2 % EX PADS: 6.0000 | MEDICATED_PAD | Freq: Once | CUTANEOUS | Status: DC

## 2023-06-21 NOTE — Progress Notes (Signed)

## 2023-06-21 NOTE — H&P (Signed)
REFERRING PHYSICIAN: Loreta Ave, MD PROVIDER: Wayne Both, MD MRN: [id number] DOB: 05-28-1991  Subjective   Chief Complaint: New Consultation ( Left breast discordant biopsy x 2, )  History of Present Illness: Alexandra Huerta is a 32 y.o. female who is seen  as an office consultation for evaluation of New Consultation ( Left breast discordant biopsy x 2, )  This is a 32 year old female had undergone screening mammography for a small mass she had felt in her right breast since a teenager. 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 However, she also had a linear group of calcifications in the left upper outer quadrant and left lower quadrant extending posteriorly to the retronipple region spanning 10.3 cm. She had 2 biopsy of the left breast showing fibrocystic changes and calcifications. 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 She has had no previous problems regarding her breast. She denies nipple discharge. There is no family history of breast cancer. She is otherwise healthy and without complaints and has no cardiopulmonary issues.  Review of Systems: A complete review of systems was obtained from the patient. I have reviewed this information and discussed as appropriate with the patient. See HPI as well for other ROS.  ROS   Medical History: Past Medical History:  Diagnosis Date  Anemia   There is no problem list on file for this patient.  History reviewed. No pertinent surgical history.   Allergies  Allergen Reactions  Amoxicillin Itching   No current outpatient medications on file prior to visit.   No current facility-administered medications on file prior to visit.   Family History  Problem Relation Age of Onset  High blood pressure (Hypertension) Father    Social History   Tobacco Use  Smoking Status Never  Smokeless Tobacco Never    Social  History   Socioeconomic History  Marital status: Married  Tobacco Use  Smoking status: Never  Smokeless tobacco: Never  Substance and Sexual Activity  Alcohol use: Not Currently  Drug use: Never   Social Determinants of Health   Financial Resource Strain: Not on File (02/24/2022)  Received from [employer]  Financial Resource Strain: 0  Food Insecurity: Not on File (02/24/2022)  Received from [employer] Insecurity  Food: 0  Transportation Needs: Not on File (02/24/2022)  Received from [employer] Needs  Transportation: 0  Physical Activity: Not on File (02/24/2022)  Received from [facility]  Physical Activity  Physical Activity: 0  Stress: Not on File (02/24/2022)  Received from [facility]  Stress  Stress: 0  Social Connections: Not on File (02/24/2022)  Received from [facility]  Social Connections  Social Connections and Isolation: 0  Housing Stability: Not on File (02/24/2022)  Received from [employer] Stability  Housing: 0   Objective:   Vitals:   BP: 118/82  Pulse: 89  Temp: 36.7 C (98 F)  SpO2: 98%  Weight: 58.5 kg (129 lb)  Height: 152.4 cm (5')   Body mass index is 25.19 kg/m.  Physical Exam   She appears well on exam  I can not palpate any breast masses. She is well-healed from her biopsies of the left breast. The nipple areolar complexes are normal. There is no axillary adenopathy  Labs, Imaging and Diagnostic Testing: Reviewed her mammograms, ultrasound, and pathology results  Assessment and Plan:   Diagnoses and all orders for this  visit:  Abnormal mammogram of left breast  Breast calcifications   I gave the copy of the pathology to the patient and we discussed her mammographic findings and the pathology in detail. 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 Both areas that were biopsied were felt to be discordant by radiology so it is recommended that we remove both these areas  for complete histologic evaluation. I discussed with her proceeding with a radioactive seed guided left breast lumpectomy x 2. I explained the surgical procedure in detail. We discussed the risks which includes but is not limited to bleeding, infection, the need for further surgery if malignancy is found, cardiopulmonary issues, postoperative recovery, etc. She understands and wishes to proceed with surgery which will be scheduled.

## 2023-06-22 ENCOUNTER — Ambulatory Visit (HOSPITAL_BASED_OUTPATIENT_CLINIC_OR_DEPARTMENT_OTHER): Payer: 59 | Admitting: Anesthesiology

## 2023-06-22 ENCOUNTER — Ambulatory Visit (HOSPITAL_BASED_OUTPATIENT_CLINIC_OR_DEPARTMENT_OTHER)
Admission: RE | Admit: 2023-06-22 | Discharge: 2023-06-22 | Disposition: A | Discharge status: Home / Self Care | Payer: 59 | Attending: Surgery | Admitting: Surgery

## 2023-06-22 ENCOUNTER — Ambulatory Visit
Admission: RE | Admit: 2023-06-22 | Discharge: 2023-06-22 | Disposition: A | Discharge status: Home / Self Care | Payer: 59 | Source: Ambulatory Visit | Attending: Surgery | Admitting: Surgery

## 2023-06-22 ENCOUNTER — Encounter (HOSPITAL_BASED_OUTPATIENT_CLINIC_OR_DEPARTMENT_OTHER): Payer: Self-pay | Admitting: Surgery

## 2023-06-22 ENCOUNTER — Encounter (HOSPITAL_BASED_OUTPATIENT_CLINIC_OR_DEPARTMENT_OTHER)
Admission: RE | Disposition: A | Discharge status: Home / Self Care | Payer: Self-pay | Source: Home / Self Care | Attending: Surgery

## 2023-06-22 ENCOUNTER — Other Ambulatory Visit: Payer: Self-pay

## 2023-06-22 DIAGNOSIS — N6032 Fibrosclerosis of left breast: Secondary | ICD-10-CM | POA: Diagnosis not present

## 2023-06-22 DIAGNOSIS — N6082 Other benign mammary dysplasias of left breast: Secondary | ICD-10-CM | POA: Diagnosis not present

## 2023-06-22 DIAGNOSIS — Z1231 Encounter for screening mammogram for malignant neoplasm of breast: Secondary | ICD-10-CM | POA: Insufficient documentation

## 2023-06-22 DIAGNOSIS — N6042 Mammary duct ectasia of left breast: Secondary | ICD-10-CM | POA: Diagnosis not present

## 2023-06-22 DIAGNOSIS — N6022 Fibroadenosis of left breast: Secondary | ICD-10-CM | POA: Diagnosis not present

## 2023-06-22 DIAGNOSIS — N6489 Other specified disorders of breast: Secondary | ICD-10-CM | POA: Insufficient documentation

## 2023-06-22 DIAGNOSIS — Z87891 Personal history of nicotine dependence: Secondary | ICD-10-CM | POA: Diagnosis not present

## 2023-06-22 DIAGNOSIS — R928 Other abnormal and inconclusive findings on diagnostic imaging of breast: Secondary | ICD-10-CM

## 2023-06-22 DIAGNOSIS — Z01818 Encounter for other preprocedural examination: Secondary | ICD-10-CM

## 2023-06-22 HISTORY — PX: BREAST EXCISIONAL BIOPSY: SUR124

## 2023-06-22 HISTORY — PX: BREAST LUMPECTOMY WITH RADIOACTIVE SEED LOCALIZATION: SHX6424

## 2023-06-22 LAB — POCT PREGNANCY, URINE: Preg Test, Ur: NEGATIVE

## 2023-06-22 SURGERY — BREAST LUMPECTOMY WITH RADIOACTIVE SEED LOCALIZATION
Anesthesia: General | Site: Breast | Laterality: Left

## 2023-06-22 MED ORDER — OXYCODONE HCL 5 MG/5ML PO SOLN: 5.0000 mg | Freq: Once | ORAL | Status: AC | PRN

## 2023-06-22 MED ORDER — CELECOXIB 200 MG PO CAPS
ORAL_CAPSULE | ORAL | Status: AC
  Filled 2023-06-22: qty 1

## 2023-06-22 MED ORDER — PROPOFOL 10 MG/ML IV BOLUS
INTRAVENOUS | Status: DC | PRN
  Administered 2023-06-22: 180 mg via INTRAVENOUS
  Administered 2023-06-22: 100 mg via INTRAVENOUS

## 2023-06-22 MED ORDER — FENTANYL CITRATE (PF) 100 MCG/2ML IJ SOLN
INTRAMUSCULAR | Status: AC
  Filled 2023-06-22: qty 2

## 2023-06-22 MED ORDER — BUPIVACAINE-EPINEPHRINE 0.5% -1:200000 IJ SOLN
INTRAMUSCULAR | Status: DC | PRN
  Administered 2023-06-22: 19 mL

## 2023-06-22 MED ORDER — ACETAMINOPHEN 500 MG PO TABS
1000.0000 mg | ORAL_TABLET | Freq: Once | ORAL | Status: AC
  Administered 2023-06-22: 1000 mg via ORAL

## 2023-06-22 MED ORDER — DEXAMETHASONE SODIUM PHOSPHATE 10 MG/ML IJ SOLN
INTRAMUSCULAR | Status: AC
  Filled 2023-06-22: qty 1

## 2023-06-22 MED ORDER — SCOPOLAMINE 1 MG/3DAYS TD PT72: 1.0000 | MEDICATED_PATCH | TRANSDERMAL | Status: DC

## 2023-06-22 MED ORDER — PROPOFOL 500 MG/50ML IV EMUL
INTRAVENOUS | Status: DC | PRN
  Administered 2023-06-22: 200 ug/kg/min via INTRAVENOUS

## 2023-06-22 MED ORDER — OXYCODONE HCL 5 MG PO TABS
ORAL_TABLET | ORAL | Status: AC
  Filled 2023-06-22: qty 1

## 2023-06-22 MED ORDER — OXYCODONE HCL 5 MG PO TABS
5.0000 mg | ORAL_TABLET | Freq: Once | ORAL | Status: AC | PRN
  Administered 2023-06-22: 5 mg via ORAL

## 2023-06-22 MED ORDER — ONDANSETRON HCL 4 MG/2ML IJ SOLN
INTRAMUSCULAR | Status: AC
  Filled 2023-06-22: qty 2

## 2023-06-22 MED ORDER — OXYCODONE HCL 5 MG PO TABS
5.0000 mg | ORAL_TABLET | Freq: Four times a day (QID) | ORAL | 0 refills | Status: DC | PRN
Start: 2023-06-22 — End: 2023-07-21

## 2023-06-22 MED ORDER — BUPIVACAINE-EPINEPHRINE (PF) 0.5% -1:200000 IJ SOLN
INTRAMUSCULAR | Status: AC
  Filled 2023-06-22: qty 30

## 2023-06-22 MED ORDER — PROMETHAZINE HCL 25 MG/ML IJ SOLN: 6.2500 mg | INTRAMUSCULAR | Status: DC | PRN

## 2023-06-22 MED ORDER — MIDAZOLAM HCL 2 MG/2ML IJ SOLN
INTRAMUSCULAR | Status: AC
  Filled 2023-06-22: qty 2

## 2023-06-22 MED ORDER — FENTANYL CITRATE (PF) 100 MCG/2ML IJ SOLN: 25.0000 ug | INTRAMUSCULAR | Status: DC | PRN

## 2023-06-22 MED ORDER — CELECOXIB 200 MG PO CAPS
200.0000 mg | ORAL_CAPSULE | Freq: Once | ORAL | Status: AC
  Administered 2023-06-22: 200 mg via ORAL

## 2023-06-22 MED ORDER — DEXAMETHASONE SODIUM PHOSPHATE 4 MG/ML IJ SOLN
INTRAMUSCULAR | Status: DC | PRN
  Administered 2023-06-22: 10 mg via INTRAVENOUS

## 2023-06-22 MED ORDER — MIDAZOLAM HCL 5 MG/5ML IJ SOLN
INTRAMUSCULAR | Status: DC | PRN
  Administered 2023-06-22: 2 mg via INTRAVENOUS

## 2023-06-22 MED ORDER — FENTANYL CITRATE (PF) 100 MCG/2ML IJ SOLN
INTRAMUSCULAR | Status: DC | PRN
  Administered 2023-06-22 (×4): 50 ug via INTRAVENOUS

## 2023-06-22 MED ORDER — CIPROFLOXACIN IN D5W 400 MG/200ML IV SOLN
400.0000 mg | INTRAVENOUS | Status: AC
  Administered 2023-06-22: 400 mg via INTRAVENOUS

## 2023-06-22 MED ORDER — LIDOCAINE 2% (20 MG/ML) 5 ML SYRINGE
INTRAMUSCULAR | Status: AC
  Filled 2023-06-22: qty 5

## 2023-06-22 MED ORDER — ONDANSETRON HCL 4 MG/2ML IJ SOLN
INTRAMUSCULAR | Status: DC | PRN
  Administered 2023-06-22: 4 mg via INTRAVENOUS

## 2023-06-22 MED ORDER — LACTATED RINGERS IV SOLN: INTRAVENOUS | Status: DC

## 2023-06-22 MED ORDER — ACETAMINOPHEN 500 MG PO TABS: 1000.0000 mg | ORAL_TABLET | ORAL | Status: AC

## 2023-06-22 MED ORDER — CIPROFLOXACIN IN D5W 400 MG/200ML IV SOLN
INTRAVENOUS | Status: AC
  Filled 2023-06-22: qty 200

## 2023-06-22 MED ORDER — PROPOFOL 10 MG/ML IV BOLUS
INTRAVENOUS | Status: AC
  Filled 2023-06-22: qty 20

## 2023-06-22 MED ORDER — ACETAMINOPHEN 500 MG PO TABS
ORAL_TABLET | ORAL | Status: AC
  Filled 2023-06-22: qty 2

## 2023-06-22 SURGICAL SUPPLY — 49 items
ADH SKN CLS APL DERMABOND .7 (GAUZE/BANDAGES/DRESSINGS) ×1
APL PRP STRL LF DISP 70% ISPRP (MISCELLANEOUS) ×1
APPLIER CLIP 9.375 MED OPEN (MISCELLANEOUS)
APR CLP MED 9.3 20 MLT OPN (MISCELLANEOUS)
BINDER BREAST 3XL (GAUZE/BANDAGES/DRESSINGS) IMPLANT
BINDER BREAST LRG (GAUZE/BANDAGES/DRESSINGS) IMPLANT
BINDER BREAST MEDIUM (GAUZE/BANDAGES/DRESSINGS) IMPLANT
BINDER BREAST XLRG (GAUZE/BANDAGES/DRESSINGS) IMPLANT
BINDER BREAST XXLRG (GAUZE/BANDAGES/DRESSINGS) IMPLANT
BLADE SURG 15 STRL LF DISP TIS (BLADE) ×1 IMPLANT
BLADE SURG 15 STRL SS (BLADE) ×1
CANISTER SUC SOCK COL 7IN (MISCELLANEOUS) IMPLANT
CANISTER SUCT 1200ML W/VALVE (MISCELLANEOUS) IMPLANT
CHLORAPREP W/TINT 26 (MISCELLANEOUS) ×1 IMPLANT
CLIP APPLIE 9.375 MED OPEN (MISCELLANEOUS) IMPLANT
COVER BACK TABLE 60X90IN (DRAPES) ×1 IMPLANT
COVER MAYO STAND STRL (DRAPES) ×1 IMPLANT
COVER PROBE CYLINDRICAL 5X96 (MISCELLANEOUS) ×1 IMPLANT
DERMABOND ADVANCED .7 DNX12 (GAUZE/BANDAGES/DRESSINGS) ×1 IMPLANT
DRAPE LAPAROSCOPIC ABDOMINAL (DRAPES) ×1 IMPLANT
DRAPE UTILITY XL STRL (DRAPES) ×1 IMPLANT
ELECT REM PT RETURN 9FT ADLT (ELECTROSURGICAL) ×1
ELECTRODE REM PT RTRN 9FT ADLT (ELECTROSURGICAL) ×1 IMPLANT
GAUZE SPONGE 4X4 12PLY STRL LF (GAUZE/BANDAGES/DRESSINGS) IMPLANT
GLOVE BIOGEL PI IND STRL 6.5 (GLOVE) IMPLANT
GLOVE SURG SIGNA 7.5 PF LTX (GLOVE) ×1 IMPLANT
GLOVE SURG SS PI 7.5 STRL IVOR (GLOVE) IMPLANT
GOWN STRL REUS W/ TWL LRG LVL3 (GOWN DISPOSABLE) ×1 IMPLANT
GOWN STRL REUS W/ TWL XL LVL3 (GOWN DISPOSABLE) ×1 IMPLANT
GOWN STRL REUS W/TWL LRG LVL3 (GOWN DISPOSABLE) ×3
GOWN STRL REUS W/TWL XL LVL3 (GOWN DISPOSABLE) ×1
KIT MARKER MARGIN INK (KITS) ×1 IMPLANT
NDL HYPO 25X1 1.5 SAFETY (NEEDLE) ×1 IMPLANT
NEEDLE HYPO 25X1 1.5 SAFETY (NEEDLE) ×1
NS IRRIG 1000ML POUR BTL (IV SOLUTION) IMPLANT
PACK BASIN DAY SURGERY FS (CUSTOM PROCEDURE TRAY) ×1 IMPLANT
PENCIL SMOKE EVACUATOR (MISCELLANEOUS) ×1 IMPLANT
SLEEVE SCD COMPRESS KNEE MED (STOCKING) ×1 IMPLANT
SPIKE FLUID TRANSFER (MISCELLANEOUS) IMPLANT
SPONGE T-LAP 4X18 ~~LOC~~+RFID (SPONGE) ×1 IMPLANT
SUT MNCRL AB 4-0 PS2 18 (SUTURE) ×1 IMPLANT
SUT SILK 2 0 SH (SUTURE) IMPLANT
SUT VIC AB 3-0 SH 27 (SUTURE) ×1
SUT VIC AB 3-0 SH 27X BRD (SUTURE) ×1 IMPLANT
SYR CONTROL 10ML LL (SYRINGE) ×1 IMPLANT
TOWEL GREEN STERILE FF (TOWEL DISPOSABLE) ×1 IMPLANT
TRAY FAXITRON CT DISP (TRAY / TRAY PROCEDURE) ×1 IMPLANT
TUBE CONNECTING 20X1/4 (TUBING) IMPLANT
YANKAUER SUCT BULB TIP NO VENT (SUCTIONS) IMPLANT

## 2023-06-22 NOTE — Anesthesia Postprocedure Evaluation (Signed)
Anesthesia Post Note  Patient: Alexandra Huerta  Procedure(s) Performed: RADIOACTIVE SEED GUIDED LUMPECTOMY x2 (Left: Breast)     Patient location during evaluation: PACU Anesthesia Type: General Level of consciousness: awake and alert Pain management: pain level controlled Vital Signs Assessment: post-procedure vital signs reviewed and stable Respiratory status: spontaneous breathing, nonlabored ventilation and respiratory function stable Cardiovascular status: stable and blood pressure returned to baseline Anesthetic complications: no   No notable events documented.  Last Vitals:  Vitals:   06/22/23 1350 06/22/23 1405  BP: 133/76 110/81  Pulse: 73 66  Resp: 14 15  Temp:    SpO2: 100% 96%    Last Pain:  Vitals:   06/22/23 1405  TempSrc:   PainSc: 0-No pain                 Beryle Lathe

## 2023-06-22 NOTE — Op Note (Signed)
Brand Males 06/22/2023   Pre-op Diagnosis: LEFT BREAST ABNORMAL MAMMOGRAM, DISCORDANT BIOPSY x2     Post-op Diagnosis: SAME  Procedure(s): LEFT BREAST RADIOACTIVE SEED GUIDED LUMPECTOMY x2  Surgeon(s): Abigail Miyamoto, MD  Anesthesia: General  Staff:  Circulator: Janace Aris, RN Relief Scrub: Raliegh Scarlet, RN Scrub Person: Griffin Basil, RN; Wardell Heath, CST  Estimated Blood Loss: Minimal               Specimens: sent to path  Indications: This is a 32 year old female who had undergone screen mammography overall palpable fibroadenoma in her breast.  This was on the right breast.  The mammogram, however, showed a 10 cm area of calcifications in the outer left breast worrisome for DCIS.  She had biopsies performed in 2 separate areas by radiology showing fibrocystic changes.  This was found to be discordant so removal of both the areas with extended tissue was recommended to completely rule out malignancy.  Findings: At the time of surgery, the more inferior, outer radioactive seed was coming out through the skin at this area.  The seed had to be removed separately.  She was very thin.  I removed the breast tissue underneath this area going down to the chest wall and marked with paint but no biopsy clip was seen.  I took 2 more areas of tissue medial to this and x-rayed both these areas but can find no biopsy clip.  The more superior lumpectomy specimen had the radioactive seed and clip present  Procedure: The patient was brought to the operating room and identified the correct patient.  She was placed upon on the operating room table and general anesthesia was induced.  Her left breast was prepped and draped in the usual sterile fashion.  Using the neoprobe I located the 2 radioactive seeds in the left breast.  1 was in the more superior outer breast and the other 1 was in the more inferior outer breast.  Unfortunately, the more inferior radioactive seed was already coming  out through the skin.  I made an incision on the lateral breast between the 2 signals with a scalpel after anesthetized skin with Marcaine.  I then dissected down to the breast tissue and then inferiorly toward the seed coming through the skin.  I took all the breast tissue underneath this area and unfortunately it undermined the skin creating a keyhole deformity in the skin.  I had removed the seed separately and placed in a cup.  This was x-rayed for radiology purposes.  I excised the palpable tissue going down to the chest wall underneath the area of the seed.  The specimen was x-rayed and no clip was identified.  I took tissue more medial in 2 separate locations to this area and could still find no clip.  The tissue appeared consistent with fibrocystic breast disease.  At this point I then dissected superiorly with the aid of neoprobe toward the second radioactive seed.  I performed a lumpectomy staying around this seed.  I then completed lumpectomy.  I marked all margins with paint and x-rays performed confirming that the upper radioactive seed and biopsy clip were in the specimen.  I again explored the lumpectomy site and cannot find any evidence of the original biopsy clip in the lower outer breast.  I then anesthetized the wound further with saline.  Hemostasis was achieved with the cautery.  I then closed the subcutaneous tissue with interrupted 3-0 Vicryl sutures and closed the skin  with a running 4-0 Monocryl.  Dermabond was then applied.  The patient tolerated the procedure well.  All the counts were correct at the end of the procedure.  She was then extubated in the operating room and taken in stable condition to the recovery room.          Abigail Miyamoto   Date: 06/22/2023  Time: 1:26 PM

## 2023-06-22 NOTE — Transfer of Care (Signed)
Immediate Anesthesia Transfer of Care Note  Patient: Alexandra Huerta  Procedure(s) Performed: RADIOACTIVE SEED GUIDED LUMPECTOMY x2 (Left: Breast)  Patient Location: PACU  Anesthesia Type:General  Level of Consciousness: sedated  Airway & Oxygen Therapy: Patient Spontanous Breathing and Patient connected to face mask oxygen  Post-op Assessment: Report given to RN and Post -op Vital signs reviewed and stable  Post vital signs: Reviewed and stable  Last Vitals:  Vitals Value Taken Time  BP 110/81 06/22/23 1335  Temp 36.3 C 06/22/23 1335  Pulse 87 06/22/23 1335  Resp 21 06/22/23 1335  SpO2 100 % 06/22/23 1335  Vitals shown include unfiled device data.  Last Pain:  Vitals:   06/22/23 1053  TempSrc: Temporal  PainSc: 0-No pain      Patients Stated Pain Goal: 3 (06/22/23 1053)  Complications: No notable events documented.

## 2023-06-22 NOTE — Discharge Instructions (Addendum)
[facility] Office Phone Number [phone]  BREAST BIOPSY/ PARTIAL MASTECTOMY: POST OP INSTRUCTIONS  Always review your discharge instruction sheet given to you by the facility where your surgery was performed.  IF YOU HAVE DISABILITY OR FAMILY LEAVE FORMS, YOU MUST BRING THEM TO THE OFFICE FOR PROCESSING.  DO NOT GIVE THEM TO YOUR DOCTOR.  A prescription for pain medication may be given to you upon discharge.  Take your pain medication as prescribed, if needed.  If narcotic pain medicine is not needed, then you may take acetaminophen (Tylenol) or ibuprofen (Advil) as needed. Take your usually prescribed medications unless otherwise directed If you need a refill on your pain medication, please contact your pharmacy.  They will contact our office to request authorization.  Prescriptions will not be filled after 5pm or on week-ends. You should eat very light the first 24 hours after surgery, such as soup, crackers, pudding, etc.  Resume your normal diet the day after surgery. Most patients will experience some swelling and bruising in the breast.  Ice packs and a good support bra will help.  Swelling and bruising can take several days to resolve.  It is common to experience some constipation if taking pain medication after surgery.  Increasing fluid intake and taking a stool softener will usually help or prevent this problem from occurring.  A mild laxative (Milk of Magnesia or Miralax) should be taken according to package directions if there are no bowel movements after 48 hours. Unless discharge instructions indicate otherwise, you may remove your bandages 24-48 hours after surgery, and you may shower at that time.  You may have steri-strips (small skin tapes) in place directly over the incision.  These strips should be left on the skin for 7-10 days.  If your surgeon used skin glue on the incision, you may shower in 24 hours.  The glue will flake off over the next 2-3 weeks.  Any  sutures or staples will be removed at the office during your follow-up visit. ACTIVITIES:  You may resume regular daily activities (gradually increasing) beginning the next day.  Wearing a good support bra or sports bra minimizes pain and swelling.  You may have sexual intercourse when it is comfortable. You may drive when you no longer are taking prescription pain medication, you can comfortably wear a seatbelt, and you can safely maneuver your car and apply brakes. RETURN TO WORK:  ______________________________________________________________________________________ Bonita Quin should see your doctor in the office for a follow-up appointment approximately two weeks after your surgery.  Your doctor's nurse will typically make your follow-up appointment when she calls you with your pathology report.  Expect your pathology report 2-3 business days after your surgery.  You may call to check if you do not hear from [place] after three days. OTHER INSTRUCTIONS: YOU MAY REMOVE THE BINDER AND SHOWER STARTING TOMORROW ICE PACK, TYLENOL, AND IBUPROFEN ALSO FOR PAIN NO VIGOROUS ACTIVITY FOR ONE WEEK _______________________________________________________________________________________________ _____________________________________________________________________________________________________________________________________ _____________________________________________________________________________________________________________________________________ _____________________________________________________________________________________________________________________________________  WHEN TO CALL YOUR DOCTOR: Fever over 101.0 Nausea and/or vomiting. Extreme swelling or bruising. Continued bleeding from incision. Increased pain, redness, or drainage from the incision.  The clinic staff is available to answer your questions during regular business hours.  Please don't hesitate to call and ask to speak to one of the  nurses for clinical concerns.  If you have a medical emergency, go to the nearest emergency room or call 911.  A surgeon from [facility] is always on call at the hospital.  For  further questions, please visit [email or web address]     Post Anesthesia Home Care Instructions  Activity: Get plenty of rest for the remainder of the day. A responsible individual must stay with you for 24 hours following the procedure.  For the next 24 hours, DO NOT: -Drive a car -Advertising copywriter -Drink alcoholic beverages -Take any medication unless instructed by your physician -Make any legal decisions or sign important papers.  Meals: Start with liquid foods such as gelatin or soup. Progress to regular foods as tolerated. Avoid greasy, spicy, heavy foods. If nausea and/or vomiting occur, drink only clear liquids until the nausea and/or vomiting subsides. Call your physician if vomiting continues.  Special Instructions/Symptoms: Your throat may feel dry or sore from the anesthesia or the breathing tube placed in your throat during surgery. If this causes discomfort, gargle with warm salt water. The discomfort should disappear within 24 hours.  If you had a scopolamine patch placed behind your ear for the management of post- operative nausea and/or vomiting:  1. The medication in the patch is effective for 72 hours, after which it should be removed.  Wrap patch in a tissue and discard in the trash. Wash hands thoroughly with soap and water. 2. You may remove the patch earlier than 72 hours if you experience unpleasant side effects which may include dry mouth, dizziness or visual disturbances. 3. Avoid touching the patch. Wash your hands with soap and water after contact with the patch.     Next dose of tylenol not available until 5pm Next dose of ibuprofen not available until 5pm

## 2023-06-22 NOTE — Anesthesia Procedure Notes (Signed)
Procedure Name: LMA Insertion Date/Time: 06/22/2023 12:23 PM  Performed by: Burna Cash, CRNAPre-anesthesia Checklist: Patient identified, Emergency Drugs available, Suction available and Patient being monitored Patient Re-evaluated:Patient Re-evaluated prior to induction Oxygen Delivery Method: Circle system utilized Preoxygenation: Pre-oxygenation with 100% oxygen Induction Type: IV induction Ventilation: Mask ventilation without difficulty LMA: LMA inserted LMA Size: 4.0 Number of attempts: 1 Airway Equipment and Method: Bite block Placement Confirmation: positive ETCO2 Tube secured with: Tape Dental Injury: Teeth and Oropharynx as per pre-operative assessment

## 2023-06-22 NOTE — Interval H&P Note (Signed)
History and Physical Interval Note: no change in H and P  06/22/2023 10:49 AM  Alexandra Huerta  has presented today for surgery, with the diagnosis of LEFT BREAS ABNORMAL MAMMOGRAM, DISCORDANT BIOPSY x2.  The various methods of treatment have been discussed with the patient and family. After consideration of risks, benefits and other options for treatment, the patient has consented to  Procedure(s) with comments: RADIOACTIVE SEED GUIDED LUMPECTOMY x2 (Left) - LMA as a surgical intervention.  The patient's history has been reviewed, patient examined, no change in status, stable for surgery.  I have reviewed the patient's chart and labs.  Questions were answered to the patient's satisfaction.     Abigail Miyamoto

## 2023-06-22 NOTE — Anesthesia Preprocedure Evaluation (Addendum)
Anesthesia Evaluation  Patient identified by MRN, date of birth, ID band Patient awake    Reviewed: Allergy & Precautions, NPO status , Patient's Chart, lab work & pertinent test results  History of Anesthesia Complications Negative for: history of anesthetic complications  Airway Mallampati: I  TM Distance: >3 FB Neck ROM: Full    Dental  (+) Dental Advisory Given, Teeth Intact   Pulmonary former smoker   Pulmonary exam normal        Cardiovascular negative cardio ROS Normal cardiovascular exam     Neuro/Psych  Headaches  negative psych ROS   GI/Hepatic negative GI ROS, Neg liver ROS,,,  Endo/Other  negative endocrine ROS    Renal/GU negative Renal ROS     Musculoskeletal negative musculoskeletal ROS (+)    Abdominal   Peds  Hematology negative hematology ROS (+)   Anesthesia Other Findings   Reproductive/Obstetrics                             Anesthesia Physical Anesthesia Plan  ASA: 1  Anesthesia Plan: General   Post-op Pain Management: Tylenol PO (pre-op)* and Celebrex PO (pre-op)*   Induction: Intravenous  PONV Risk Score and Plan: 3 and Treatment may vary due to age or medical condition, Ondansetron, Dexamethasone, Midazolam and Scopolamine patch - Pre-op  Airway Management Planned: LMA  Additional Equipment: None  Intra-op Plan:   Post-operative Plan: Extubation in OR  Informed Consent: I have reviewed the patients History and Physical, chart, labs and discussed the procedure including the risks, benefits and alternatives for the proposed anesthesia with the patient or authorized representative who has indicated his/her understanding and acceptance.     Dental advisory given  Plan Discussed with: CRNA and Anesthesiologist  Anesthesia Plan Comments:        Anesthesia Quick Evaluation

## 2023-06-23 ENCOUNTER — Encounter (HOSPITAL_BASED_OUTPATIENT_CLINIC_OR_DEPARTMENT_OTHER): Payer: Self-pay | Admitting: Surgery

## 2023-06-23 LAB — SURGICAL PATHOLOGY

## 2023-07-04 ENCOUNTER — Telehealth: Payer: Self-pay | Admitting: Urology

## 2023-07-04 NOTE — Telephone Encounter (Signed)
DOS - 07/28/23  LAPIDUS PROCEDURE INCLUDING BUNIONECTOMY LEFT --- [id number]  [employer]   SPOKE WITH NICHELLE WITH [employer] AND SHE STATED THAT FOR CPT CODE 09811 NO AUTH IS REQUIRED SINCE [employer] IS PRIMARY INSURANCE.  CALL REF # [id number]  [employer]   SPOKE WITH MINA V. WITH [employer] AND SHE STATED THAT FOR CPT CODE 62130 NO PRIOR AUTH IS REQUIRED.  CALL REF # [id number]

## 2023-07-21 ENCOUNTER — Encounter
Admission: RE | Admit: 2023-07-21 | Discharge: 2023-07-21 | Disposition: A | Discharge status: Home / Self Care | Payer: 59 | Source: Ambulatory Visit | Attending: Podiatry | Admitting: Podiatry

## 2023-07-21 DIAGNOSIS — Z01818 Encounter for other preprocedural examination: Secondary | ICD-10-CM

## 2023-07-21 HISTORY — DX: Hallux valgus (acquired), right foot: M20.12

## 2023-07-21 HISTORY — DX: Major osseous defect, unspecified site: M89.70

## 2023-07-21 HISTORY — DX: Hallux valgus (acquired), right foot: M20.11

## 2023-07-21 NOTE — Pre-Procedure Instructions (Signed)
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

## 2023-07-21 NOTE — Patient Instructions (Signed)
Your procedure is scheduled on:07-28-23 Friday Report to the Registration Desk on the 1st floor of the [facility].Then proceed to the 2nd floor Surgery Desk To find out your arrival time, please call [phone] between 1PM - 3PM on:07-27-23 Thursday If your arrival time is 6:00 am, do not arrive before that time as the [facility] entrance doors do not open until 6:00 am.  REMEMBER: Instructions that are not followed completely may result in serious medical risk, up to and including death; or upon the discretion of your surgeon and anesthesiologist your surgery may need to be rescheduled.  Do not eat food after midnight the night before surgery.  No gum chewing or hard candies.  You may however, drink CLEAR liquids up to 2 hours before you are scheduled to arrive for your surgery. Do not drink anything within 2 hours of your scheduled arrival time.  Clear liquids include: - water  - apple juice without pulp - gatorade (not RED colors) - black coffee or tea (Do NOT add milk or creamers to the coffee or tea) Do NOT drink anything that is not on this list.  One week prior to surgery: Stop Anti-inflammatories (NSAIDS) such as Advil, Aleve, Ibuprofen, Motrin, Naproxen, Naprosyn and Aspirin based products such as Excedrin, Goody's Powder, BC Powder.You may however, take Tylenol if needed for pain up until the day of surgery. Stop ANY OVER THE COUNTER supplements/vitamins NOW (07-21-23) until after surgery.  Do NOT take any medication the day of surgery  No Alcohol for 24 hours before or after surgery.  No Smoking including e-cigarettes for 24 hours before surgery.  No chewable tobacco products for at least 6 hours before surgery.  No nicotine patches on the day of surgery.  Do not use any "recreational" drugs for at least a week (preferably 2 weeks) before your surgery.  Please be advised that the combination of cocaine and anesthesia may have negative outcomes, up to and including  death. If you test positive for cocaine, your surgery will be cancelled.  On the morning of surgery brush your teeth with toothpaste and water, you may rinse your mouth with mouthwash if you wish. Do not swallow any toothpaste or mouthwash.  Use CHG Soap as directed on instruction sheet.  Do not wear jewelry, make-up, hairpins, clips or nail polish.  For welded (permanent) jewelry: bracelets, anklets, waist bands, etc.  Please have this removed prior to surgery.  If it is not removed, there is a chance that hospital personnel will need to cut it off on the day of surgery.  Do not wear lotions, powders, or perfumes.   Do not shave body hair from the neck down 48 hours before surgery.  Contact lenses, hearing aids and dentures may not be worn into surgery.  Do not bring valuables to the hospital. [facility] is not responsible for any missing/lost belongings or valuables.  Notify your doctor if there is any change in your medical condition (cold, fever, infection).  Wear comfortable clothing (specific to your surgery type) to the hospital.  After surgery, you can help prevent lung complications by doing breathing exercises.  Take deep breaths and cough every 1-2 hours. Your doctor may order a device called an Incentive Spirometer to help you take deep breaths. When coughing or sneezing, hold a pillow firmly against your incision with both hands. This is called "splinting." Doing this helps protect your incision. It also decreases belly discomfort.  If you are being admitted to the hospital  overnight, leave your suitcase in the car. After surgery it may be brought to your room.  In case of increased patient census, it may be necessary for you, the patient, to continue your postoperative care in the Same Day Surgery department.  If you are being discharged the day of surgery, you will not be allowed to drive home. You will need a responsible individual to drive you home and stay with you  for 24 hours after surgery.   If you are taking public transportation, you will need to have a responsible individual with you.  Please call the Pre-admissions Testing Dept. at [phone] if you have any questions about these instructions.  Surgery Visitation Policy:  Patients having surgery or a procedure may have two visitors.  Children under the age of 81 must have an adult with them who is not the patient.     Preparing for Surgery with CHLORHEXIDINE GLUCONATE (CHG) Soap  Chlorhexidine Gluconate (CHG) Soap  o An antiseptic cleaner that kills germs and bonds with the skin to continue killing germs even after washing  o Used for showering the night before surgery and morning of surgery  Before surgery, you can play an important role by reducing the number of germs on your skin.  CHG (Chlorhexidine gluconate) soap is an antiseptic cleanser which kills germs and bonds with the skin to continue killing germs even after washing.  Please do not use if you have an allergy to CHG or antibacterial soaps. If your skin becomes reddened/irritated stop using the CHG.  1. Shower the NIGHT BEFORE SURGERY and the MORNING OF SURGERY with CHG soap.  2. If you choose to wash your hair, wash your hair first as usual with your normal shampoo.  3. After shampooing, rinse your hair and body thoroughly to remove the shampoo.  4. Use CHG as you would any other liquid soap. You can apply CHG directly to the skin and wash gently with a scrungie or a clean washcloth.  5. Apply the CHG soap to your body only from the neck down. Do not use on open wounds or open sores. Avoid contact with your eyes, ears, mouth, and genitals (private parts). Wash face and genitals (private parts) with your normal soap.  6. Wash thoroughly, paying special attention to the area where your surgery will be performed.  7. Thoroughly rinse your body with warm water.  8. Do not shower/wash with your normal soap after using  and rinsing off the CHG soap.  9. Pat yourself dry with a clean towel.  10. Wear clean pajamas to bed the night before surgery.  12. Place clean sheets on your bed the night of your first shower and do not sleep with pets.  13. Shower again with the CHG soap on the day of surgery prior to arriving at the hospital.  14. Do not apply any deodorants/lotions/powders.  15. Please wear clean clothes to the hospital.

## 2023-07-24 ENCOUNTER — Telehealth: Payer: Self-pay | Admitting: Podiatry

## 2023-07-24 NOTE — Telephone Encounter (Signed)
DOS - 07/28/2023  LAPIDUS PROCEDURE INCLUDING BUNIONECTOMY LT - 40981  AETNA EFFECTIVE DATE  - 03/08/2023  PER RON M WITH AET[employer], CPT CODE 19147 DOES NOT REQUIRE PRIOR AUTHORIZATION.   REFERENCE NUMBER - [id number]

## 2023-07-25 ENCOUNTER — Telehealth: Payer: Self-pay | Admitting: Podiatry

## 2023-07-25 NOTE — Telephone Encounter (Signed)
Called patient in regards to H&P form. We have received it from her provider but it does not say that she is cleared for surgery. The form states that she has not been seen in quite some time. Reminded her that without this for, she will not be able to have surgery.

## 2023-07-28 ENCOUNTER — Encounter: Admission: RE | Payer: Self-pay | Source: Home / Self Care

## 2023-07-28 ENCOUNTER — Ambulatory Visit: Admission: RE | Admit: 2023-07-28 | Payer: 59 | Source: Home / Self Care | Admitting: Podiatry

## 2023-07-28 SURGERY — HALLUX VALGUS LAPIDUS
Anesthesia: Choice | Laterality: Left

## 2023-08-03 ENCOUNTER — Encounter: Payer: 59 | Admitting: Podiatry

## 2023-08-17 ENCOUNTER — Encounter: Payer: 59 | Admitting: Podiatry

## 2023-09-07 ENCOUNTER — Encounter: Payer: 59 | Admitting: Podiatry

## 2024-03-21 ENCOUNTER — Other Ambulatory Visit: Payer: Self-pay | Admitting: Internal Medicine

## 2024-03-21 DIAGNOSIS — N6311 Unspecified lump in the right breast, upper outer quadrant: Secondary | ICD-10-CM

## 2024-04-17 ENCOUNTER — Other Ambulatory Visit

## 2024-04-17 ENCOUNTER — Encounter

## 2024-05-01 ENCOUNTER — Emergency Department (HOSPITAL_COMMUNITY)

## 2024-05-01 ENCOUNTER — Emergency Department (HOSPITAL_COMMUNITY)
Admission: EM | Admit: 2024-05-01 | Discharge: 2024-05-01 | Discharge status: Against Medical Advice | Attending: Emergency Medicine | Admitting: Emergency Medicine

## 2024-05-01 ENCOUNTER — Encounter (HOSPITAL_COMMUNITY): Payer: Self-pay

## 2024-05-01 ENCOUNTER — Emergency Department (HOSPITAL_COMMUNITY)
Admission: EM | Admit: 2024-05-01 | Discharge: 2024-05-02 | Disposition: A | Discharge status: Home / Self Care | Source: Home / Self Care | Attending: Emergency Medicine | Admitting: Emergency Medicine

## 2024-05-01 ENCOUNTER — Other Ambulatory Visit: Payer: Self-pay

## 2024-05-01 ENCOUNTER — Ambulatory Visit (HOSPITAL_COMMUNITY)
Admission: EM | Admit: 2024-05-01 | Discharge: 2024-05-01 | Disposition: A | Discharge status: Home / Self Care | Attending: Emergency Medicine | Admitting: Emergency Medicine

## 2024-05-01 DIAGNOSIS — D72829 Elevated white blood cell count, unspecified: Secondary | ICD-10-CM | POA: Diagnosis not present

## 2024-05-01 DIAGNOSIS — A084 Viral intestinal infection, unspecified: Secondary | ICD-10-CM | POA: Diagnosis not present

## 2024-05-01 DIAGNOSIS — R112 Nausea with vomiting, unspecified: Secondary | ICD-10-CM

## 2024-05-01 DIAGNOSIS — Z3202 Encounter for pregnancy test, result negative: Secondary | ICD-10-CM

## 2024-05-01 DIAGNOSIS — R110 Nausea: Secondary | ICD-10-CM | POA: Insufficient documentation

## 2024-05-01 DIAGNOSIS — Z5321 Procedure and treatment not carried out due to patient leaving prior to being seen by health care provider: Secondary | ICD-10-CM | POA: Insufficient documentation

## 2024-05-01 DIAGNOSIS — D649 Anemia, unspecified: Secondary | ICD-10-CM | POA: Diagnosis not present

## 2024-05-01 DIAGNOSIS — Z91040 Latex allergy status: Secondary | ICD-10-CM | POA: Diagnosis not present

## 2024-05-01 DIAGNOSIS — R1084 Generalized abdominal pain: Secondary | ICD-10-CM | POA: Insufficient documentation

## 2024-05-01 DIAGNOSIS — R079 Chest pain, unspecified: Secondary | ICD-10-CM | POA: Diagnosis not present

## 2024-05-01 DIAGNOSIS — R1013 Epigastric pain: Secondary | ICD-10-CM

## 2024-05-01 LAB — CBC WITH DIFFERENTIAL/PLATELET
Abs Immature Granulocytes: 0.03 10*3/uL (ref 0.00–0.07)
Basophils Absolute: 0.1 10*3/uL (ref 0.0–0.1)
Basophils Relative: 1 %
Eosinophils Absolute: 0.1 10*3/uL (ref 0.0–0.5)
Eosinophils Relative: 2 %
HCT: 37.4 % (ref 36.0–46.0)
Hemoglobin: 11.7 g/dL — ABNORMAL LOW (ref 12.0–15.0)
Immature Granulocytes: 0 %
Lymphocytes Relative: 31 %
Lymphs Abs: 2.4 10*3/uL (ref 0.7–4.0)
MCH: 25.4 pg — ABNORMAL LOW (ref 26.0–34.0)
MCHC: 31.3 g/dL (ref 30.0–36.0)
MCV: 81.3 fL (ref 80.0–100.0)
Monocytes Absolute: 0.4 10*3/uL (ref 0.1–1.0)
Monocytes Relative: 5 %
Neutro Abs: 4.7 10*3/uL (ref 1.7–7.7)
Neutrophils Relative %: 61 %
Platelets: 287 10*3/uL (ref 150–400)
RBC: 4.6 MIL/uL (ref 3.87–5.11)
RDW: 14.8 % (ref 11.5–15.5)
WBC: 7.7 10*3/uL (ref 4.0–10.5)
nRBC: 0 % (ref 0.0–0.2)

## 2024-05-01 LAB — COMPREHENSIVE METABOLIC PANEL WITH GFR
ALT: 24 U/L (ref 0–44)
AST: 30 U/L (ref 15–41)
Albumin: 4 g/dL (ref 3.5–5.0)
Alkaline Phosphatase: 47 U/L (ref 38–126)
Anion gap: 11 (ref 5–15)
BUN: 8 mg/dL (ref 6–20)
CO2: 21 mmol/L — ABNORMAL LOW (ref 22–32)
Calcium: 9.2 mg/dL (ref 8.9–10.3)
Chloride: 105 mmol/L (ref 98–111)
Creatinine, Ser: 0.92 mg/dL (ref 0.44–1.00)
GFR, Estimated: >60 mL/min (ref >60)
Glucose, Bld: 87 mg/dL (ref 70–99)
Potassium: 4.2 mmol/L (ref 3.5–5.1)
Sodium: 137 mmol/L (ref 135–145)
Total Bilirubin: 0.9 mg/dL (ref 0.0–1.2)
Total Protein: 7.2 g/dL (ref 6.5–8.1)

## 2024-05-01 LAB — POCT URINALYSIS DIP (MANUAL ENTRY)
Bilirubin, UA: NEGATIVE
Blood, UA: NEGATIVE
Glucose, UA: NEGATIVE mg/dL
Leukocytes, UA: NEGATIVE
Nitrite, UA: NEGATIVE
Spec Grav, UA: 1.025 (ref 1.010–1.025)
Urobilinogen, UA: 0.2 U/dL
pH, UA: 6.5 (ref 5.0–8.0)

## 2024-05-01 LAB — CBC
HCT: 36.4 % (ref 36.0–46.0)
Hemoglobin: 11.7 g/dL — ABNORMAL LOW (ref 12.0–15.0)
MCH: 25.5 pg — ABNORMAL LOW (ref 26.0–34.0)
MCHC: 32.1 g/dL (ref 30.0–36.0)
MCV: 79.3 fL — ABNORMAL LOW (ref 80.0–100.0)
Platelets: 293 10*3/uL (ref 150–400)
RBC: 4.59 MIL/uL (ref 3.87–5.11)
RDW: 14.6 % (ref 11.5–15.5)
WBC: 10.8 10*3/uL — ABNORMAL HIGH (ref 4.0–10.5)
nRBC: 0 % (ref 0.0–0.2)

## 2024-05-01 LAB — BASIC METABOLIC PANEL WITH GFR
Anion gap: 12 (ref 5–15)
BUN: 8 mg/dL (ref 6–20)
CO2: 22 mmol/L (ref 22–32)
Calcium: 9.2 mg/dL (ref 8.9–10.3)
Chloride: 100 mmol/L (ref 98–111)
Creatinine, Ser: 0.91 mg/dL (ref 0.44–1.00)
GFR, Estimated: >60 mL/min (ref >60)
Glucose, Bld: 87 mg/dL (ref 70–99)
Potassium: 3.7 mmol/L (ref 3.5–5.1)
Sodium: 134 mmol/L — ABNORMAL LOW (ref 135–145)

## 2024-05-01 LAB — URINALYSIS, ROUTINE W REFLEX MICROSCOPIC
Bilirubin Urine: NEGATIVE
Glucose, UA: NEGATIVE mg/dL
Hgb urine dipstick: NEGATIVE
Ketones, ur: 5 mg/dL — AB
Leukocytes,Ua: NEGATIVE
Nitrite: NEGATIVE
Protein, ur: NEGATIVE mg/dL
Specific Gravity, Urine: 1.023 (ref 1.005–1.030)
pH: 6 (ref 5.0–8.0)

## 2024-05-01 LAB — HCG, SERUM, QUALITATIVE: Preg, Serum: NEGATIVE

## 2024-05-01 LAB — LIPASE, BLOOD: Lipase: 25 U/L (ref 11–51)

## 2024-05-01 LAB — POCT URINE PREGNANCY: Preg Test, Ur: NEGATIVE

## 2024-05-01 LAB — TROPONIN I (HIGH SENSITIVITY)
Troponin I (High Sensitivity): <2 ng/L (ref <18)
Troponin I (High Sensitivity): 2 ng/L (ref <18)

## 2024-05-01 MED ORDER — ONDANSETRON HCL 4 MG/2ML IJ SOLN
INTRAMUSCULAR | Status: AC
  Filled 2024-05-01: qty 2

## 2024-05-01 MED ORDER — ONDANSETRON 4 MG PO TBDP
4.0000 mg | ORAL_TABLET | Freq: Once | ORAL | Status: AC
  Administered 2024-05-01: 4 mg via ORAL
  Filled 2024-05-01: qty 1

## 2024-05-01 MED ORDER — ALUM & MAG HYDROXIDE-SIMETH 200-200-20 MG/5ML PO SUSP
ORAL | Status: AC
  Filled 2024-05-01: qty 30

## 2024-05-01 MED ORDER — ONDANSETRON HCL 4 MG/2ML IJ SOLN
4.0000 mg | Freq: Once | INTRAMUSCULAR | Status: AC
  Administered 2024-05-01: 4 mg via INTRAMUSCULAR

## 2024-05-01 MED ORDER — ALUM & MAG HYDROXIDE-SIMETH 200-200-20 MG/5ML PO SUSP
30.0000 mL | Freq: Once | ORAL | Status: AC
Start: 2024-05-01 — End: 2024-05-01
  Administered 2024-05-01: 30 mL via ORAL

## 2024-05-01 MED ORDER — LIDOCAINE VISCOUS HCL 2 % MT SOLN
OROMUCOSAL | Status: AC
  Filled 2024-05-01: qty 15

## 2024-05-01 MED ORDER — LIDOCAINE VISCOUS HCL 2 % MT SOLN
15.0000 mL | Freq: Once | OROMUCOSAL | Status: AC
  Administered 2024-05-01: 15 mL via OROMUCOSAL

## 2024-05-01 MED ORDER — ONDANSETRON 4 MG PO TBDP: 4.0000 mg | ORAL_TABLET | Freq: Three times a day (TID) | ORAL | 0 refills | Status: AC | PRN

## 2024-05-01 NOTE — ED Notes (Signed)
Unable to provide urine

## 2024-05-01 NOTE — ED Provider Notes (Signed)
 [facility]    CSN: [id number] Arrival date & time: 05/01/24  1517      History   Chief Complaint Chief Complaint  Patient presents with   Abdominal Pain    HPI Alexandra Huerta is a 33 y.o. female.   Alexandra Huerta is a 33 y.o. female presenting for chief complaint of sharp epigastric abdominal pain that started last night along with nausea and vomiting.  She has had multiple episodes of nonbilious/nonbloody emesis throughout the day today.  Denies diarrhea, dizziness, urinary symptoms, flank pain, headache, fever/chills, viral URI symptoms, lower abdominal pain, diarrhea, and chance of pregnancy.  Last menstrual cycle was April 12, 2024.  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  Denies recent alcohol use, NSAID use, and intake of spicy/fatty foods.  No hemoptysis or blood/mucus in the stool.  She went to the emergency room for her symptoms where labs were drawn and she was given Zofran  with minimal relief in nausea.  She is here urgent care due to long wait in emergency department.  Last dose of Zofran  4 mg ODT from the ER was 3 hours ago.   Abdominal Pain   Past Medical History:  Diagnosis Date   Anemia    Hallux valgus, bilateral    Headache    Major osseous defect     Patient Active Problem List   Diagnosis Date Noted   Major osseous defects    Hallux valgus (acquired), right foot    Migraine with aura and with status migrainosus, not intractable 01/03/2019    Past Surgical History:  Procedure Laterality Date   BREAST BIOPSY Left 01/12/2023   MM LT BREAST BX W LOC DEV 1ST LESION IMAGE BX SPEC STEREO GUIDE 01/12/2023 [facility]   BREAST BIOPSY Left 01/12/2023   MM LT BREAST BX W LOC DEV EA AD LESION IMG BX SPEC STEREO GUIDE 01/12/2023 [facility]   BREAST BIOPSY  06/21/2023   MM LT RADIOACTIVE SEED LOC MAMMO GUIDE 06/21/2023 [facility]   BREAST BIOPSY  06/21/2023   MM LT RADIOACTIVE SEED EA ADD LESION LOC MAMMO  GUIDE 06/21/2023 [facility]   BREAST LUMPECTOMY WITH RADIOACTIVE SEED LOCALIZATION Left 06/22/2023   Procedure: RADIOACTIVE SEED GUIDED LUMPECTOMY x2;  Surgeon: Vernetta Berg, MD;  Location: [facility];  Service: General;  Laterality: Left;  LMA   CESAREAN SECTION  2010, 2016, 2017   HALLUX VALGUS LAPIDUS Right 09/17/2021   Procedure: HALLUX VALGUS LAPIDUS, BONE GRAFT;  Surgeon: Silva Juliene SAUNDERS, DPM;  Location: [facility];  Service: Podiatry;  Laterality: Right;  WITH BLOCK    OB History   No obstetric history on file.      Home Medications    Prior to Admission medications   Medication Sig Start Date End Date Taking? Authorizing Provider  ondansetron  (ZOFRAN -ODT) 4 MG disintegrating tablet Take 1 tablet (4 mg total) by mouth every 8 (eight) hours as needed for nausea or vomiting. 05/01/24  Yes Enedelia Dorna HERO, FNP    Family History Family History  Problem Relation Age of Onset   Lupus Mother     Social History Social History   Tobacco Use   Smoking status: Former    Current packs/day: 0.00    Types: Cigarettes    Quit date: 12/2020    Years since quitting: 3.3   Smokeless tobacco: Never   Tobacco comments:    09/05/18 5 cigs daily  Vaping Use   Vaping  status: Every Day   Substances: Nicotine, Flavoring  Substance Use Topics   Alcohol use: Not Currently   Drug use: Never     Allergies   Clindamycin/lincomycin, Penicillins, and Latex   Review of Systems Review of Systems  Gastrointestinal:  Positive for abdominal pain.  Per HPI   Physical Exam Triage Vital Signs ED Triage Vitals  Encounter Vitals Group     BP 05/01/24 1550 (!) 152/92     Girls Systolic BP Percentile --      Girls Diastolic BP Percentile --      Boys Systolic BP Percentile --      Boys Diastolic BP Percentile --      Pulse Rate 05/01/24 1550 (!) 56     Resp 05/01/24 1550 16     Temp 05/01/24 1550 98.5 F (36.9 C)     Temp Source 05/01/24 1550 Oral      SpO2 05/01/24 1550 98 %     Weight --      Height --      Head Circumference --      Peak Flow --      Pain Score 05/01/24 1546 8     Pain Loc --      Pain Education --      Exclude from Growth Chart --    No data found.  Updated Vital Signs BP (!) 152/92 (BP Location: Left Arm)   Pulse (!) 56   Temp 98.5 F (36.9 C) (Oral)   Resp 16   LMP 04/12/2024 (Approximate)   SpO2 98%   Visual Acuity Right Eye Distance:   Left Eye Distance:   Bilateral Distance:    Right Eye Near:   Left Eye Near:    Bilateral Near:     Physical Exam Vitals and nursing note reviewed.  Constitutional:      Appearance: She is not ill-appearing or toxic-appearing.  HENT:     Head: Normocephalic and atraumatic.     Right Ear: Hearing and external ear normal.     Left Ear: Hearing and external ear normal.     Nose: Nose normal.     Mouth/Throat:     Lips: Pink.     Mouth: Mucous membranes are moist. No injury or oral lesions.     Dentition: Normal dentition.     Tongue: No lesions.     Pharynx: Oropharynx is clear. Uvula midline. No pharyngeal swelling, oropharyngeal exudate, posterior oropharyngeal erythema, uvula swelling or postnasal drip.     Tonsils: No tonsillar exudate.   Eyes:     General: Lids are normal. Vision grossly intact. Gaze aligned appropriately.     Extraocular Movements: Extraocular movements intact.     Conjunctiva/sclera: Conjunctivae normal.   Neck:     Trachea: Trachea and phonation normal.   Cardiovascular:     Rate and Rhythm: Normal rate and regular rhythm.     Heart sounds: Normal heart sounds, S1 normal and S2 normal.  Pulmonary:     Effort: Pulmonary effort is normal. No respiratory distress.     Breath sounds: Normal breath sounds and air entry. No stridor. No wheezing, rhonchi or rales.  Chest:     Chest wall: No tenderness.  Abdominal:     General: Abdomen is flat. Bowel sounds are normal.     Palpations: Abdomen is soft.     Tenderness: There is  abdominal tenderness in the periumbilical area. There is no right CVA tenderness, left CVA tenderness, guarding or  rebound. Negative signs include Murphy's sign and McBurney's sign.     Comments: No peritoneal signs on abdominal exam.   Musculoskeletal:     Cervical back: Neck supple.  Lymphadenopathy:     Cervical: No cervical adenopathy.   Skin:    General: Skin is warm and dry.     Capillary Refill: Capillary refill takes less than 2 seconds.     Findings: No rash.   Neurological:     General: No focal deficit present.     Mental Status: She is alert and oriented to person, place, and time. Mental status is at baseline.     Cranial Nerves: No dysarthria or facial asymmetry.   Psychiatric:        Mood and Affect: Mood normal.        Speech: Speech normal.        Behavior: Behavior normal.        Thought Content: Thought content normal.        Judgment: Judgment normal.      UC Treatments / Results  Labs (all labs ordered are listed, but only abnormal results are displayed) Labs Reviewed  POCT URINALYSIS DIP (MANUAL ENTRY) - Abnormal; Notable for the following components:      Result Value   Clarity, UA cloudy (*)    Ketones, POC UA large (80) (*)    Protein Ur, POC trace (*)    All other components within normal limits  POCT URINE PREGNANCY    EKG   Radiology No results found.  Procedures Procedures (including critical care time)  Medications Ordered in UC Medications  ondansetron  (ZOFRAN ) injection 4 mg (4 mg Intramuscular Given 05/01/24 1630)  alum & mag hydroxide-simeth (MAALOX/MYLANTA) 200-200-20 MG/5ML suspension 30 mL (30 mLs Oral Given 05/01/24 1639)  lidocaine  (XYLOCAINE ) 2 % viscous mouth solution 15 mL (15 mLs Mouth/Throat Given 05/01/24 1639)    Initial Impression / Assessment and Plan / UC Course  I have reviewed the triage vital signs and the nursing notes.  Pertinent labs & imaging results that were available during my care of the patient were  reviewed by me and considered in my medical decision making (see chart for details).   1.  Viral gastroenteritis, nausea and vomiting, periumbilical abdominal pain Evaluation suggests viral gastrointestinal illness etiology.  Reviewed results from ER workup today including unremarkable CBC, CMP, lipase, and troponin.  EKG is without ST/T wave changes and shows sinus tachycardia. Patient nontoxic appearing with hemodynamically stable vital signs, abdominal exam without peritoneal signs/focal tenderness. Will manage this with antiemetic (Zofran ) as needed, OTC medicines as needed for discomfort/pain, increased fluids, and rest. Zofran  4 mg IM and GI cocktail given in clinic with significant improvement in abdominal pain and nausea prior to discharge.  Patient able to tolerate sips of water without throwing up prior to discharge.  Liquid/bland diet initially, then increase diet as tolerated.  Urine pregnancy is negative.  Counseled patient on potential for adverse effects with medications prescribed/recommended today, strict ER and return-to-clinic precautions discussed, patient verbalized understanding.    Final Clinical Impressions(s) / UC Diagnoses   Final diagnoses:  Viral gastroenteritis  Abdominal pain, epigastric  Nausea and vomiting, unspecified vomiting type  Negative pregnancy test     Discharge Instructions      Your evaluation suggests that your symptoms are most likely due to viral stomach illness (gastroenteritis/stomach bug) which will improve on its own with rest and fluids in the next few days.   Take zofran  to  help with nausea every 8 hours as needed. You may use over the counter medicines for aches and pains such as tylenol  as needed.  Start sipping on liquids (broth, water, gatorade, etc). If you are able to keep liquids down without vomiting for 1-2 hours, you may eat bland foods like jello, pudding, applesauce, bananas, rice, and white toast. Once you can tolerate  blands, you may return to normal diet.   Pedialyte or gatorolyte may help to prevent/fix dehydration due to vomiting and diarrhea.  Please follow up with your primary care provider for further management. Return if you experience worsening or uncontrolled pain, inability to tolerate fluids by mouth, difficulty breathing, fevers 100.3F or greater, recurrent vomiting, or any other concerning symptoms.      ED Prescriptions     Medication Sig Dispense Auth. Provider   ondansetron  (ZOFRAN -ODT) 4 MG disintegrating tablet Take 1 tablet (4 mg total) by mouth every 8 (eight) hours as needed for nausea or vomiting. 20 tablet Enedelia Dorna HERO, FNP      PDMP not reviewed this encounter.   Enedelia Dorna HERO, [place] 05/01/24 [phone]

## 2024-05-01 NOTE — ED Triage Notes (Signed)
 Patient here today with c/o abd pain and chest pain X 2 days. She vomiting 4 times in the last 24 hours. She took Excedrin and sinus decongest ion yesterday. Patient has also been having sinus pressures.

## 2024-05-01 NOTE — Discharge Instructions (Signed)

## 2024-05-01 NOTE — ED Provider Triage Note (Signed)
 Emergency Medicine Provider Triage Evaluation Note  Alexandra Huerta , a 33 y.o. female  was evaluated in triage.  Pt complains of generalized abdominal pain since yesterday, and also endorsing nausea but no vomiting.  No MAT.  No prior surgical history of her abdomen.  No fevers  Review of Systems  Positive: Abdominal pain, nausea Negative: Fever  Physical Exam  BP (!) 140/89   Pulse 68   Temp 98.2 F (36.8 C) (Oral)   Resp 18   Ht 5' (1.524 m)   Wt 62.6 kg   LMP 03/25/2024   SpO2 99%   BMI 26.95 kg/m  Gen:   Awake, no distress   Resp:  Normal effort  MSK:   Moves extremities without difficulty  Other:  No guarding or rebound  Medical Decision Making  Medically screening exam initiated at 12:08 PM.  Appropriate orders placed.  Alexandra Huerta was informed that the remainder of the evaluation will be completed by another provider, this initial triage assessment does not replace that evaluation, and the importance of remaining in the ED until their evaluation is complete.     Tasnim Balentine, PA-C 05/01/24 1210

## 2024-05-01 NOTE — ED Triage Notes (Signed)
 Patient reports central chest pain this morning , mild SOB and emesis , no fever or diaphoresis .

## 2024-05-01 NOTE — ED Triage Notes (Signed)
 Pt having abd pain since yesterday. Pain is epigastric and sharp. Also endorses nv. Pain worse after eating. Denies diarrhea or urinary symptoms. No fevers

## 2024-05-02 LAB — TROPONIN I (HIGH SENSITIVITY): Troponin I (High Sensitivity): 2 ng/L (ref <18)

## 2024-05-02 MED ORDER — PANTOPRAZOLE SODIUM 40 MG IV SOLR
40.0000 mg | Freq: Once | INTRAVENOUS | Status: AC
  Administered 2024-05-02: 40 mg via INTRAVENOUS
  Filled 2024-05-02: qty 10

## 2024-05-02 MED ORDER — SODIUM CHLORIDE 0.9 % IV BOLUS
1000.0000 mL | Freq: Once | INTRAVENOUS | Status: AC
  Administered 2024-05-02: 1000 mL via INTRAVENOUS

## 2024-05-02 MED ORDER — ACETAMINOPHEN 325 MG PO TABS
650.0000 mg | ORAL_TABLET | Freq: Once | ORAL | Status: AC
  Administered 2024-05-02: 650 mg via ORAL
  Filled 2024-05-02: qty 2

## 2024-05-02 MED ORDER — ONDANSETRON HCL 4 MG/2ML IJ SOLN
4.0000 mg | Freq: Once | INTRAMUSCULAR | Status: AC
  Administered 2024-05-02: 4 mg via INTRAVENOUS
  Filled 2024-05-02: qty 2

## 2024-05-02 MED ORDER — FAMOTIDINE 20 MG PO TABS: 20.0000 mg | ORAL_TABLET | Freq: Two times a day (BID) | ORAL | 0 refills | Status: AC

## 2024-05-02 NOTE — ED Notes (Signed)
 Patient did respond to name being called. Moving him OTF.

## 2024-05-02 NOTE — ED Notes (Signed)
 Patient not responding to his name being called 4+ times.

## 2024-05-02 NOTE — ED Notes (Signed)
 Pt provided water to complete fluid challenge.

## 2024-05-02 NOTE — Discharge Instructions (Addendum)
 Please read and follow all provided instructions.  Your diagnoses today include:  1. Nausea and vomiting, unspecified vomiting type   2. Chest pain, unspecified type     Tests performed today include: An EKG of your heart: no changes from previous EKG in 2022 A chest x-ray Cardiac enzymes - a blood test for heart muscle damage Blood counts and electrolytes Vital signs. See below for your results today.   Medications prescribed:  Pepcid (famotidine) - antihistamine  You can find this medication over-the-counter.   DO NOT exceed:  20mg  Pepcid every 12 hours  Take any prescribed medications only as directed.  Follow-up instructions: Please follow-up with your primary care provider as soon as you can for further evaluation of your symptoms.   Please adhere to a bland diet, and slowly advance your diet to normal foods over the next 1 to 2 days.  Return instructions:  SEEK IMMEDIATE MEDICAL ATTENTION IF: You have severe chest pain, especially if the pain is crushing or pressure-like and spreads to the arms, back, neck, or jaw, or if you have sweating, nausea or vomiting, or trouble with breathing. THIS IS AN EMERGENCY. Do not wait to see if the pain will go away. Get medical help at once. Call 911. DO NOT drive yourself to the hospital.  Your chest pain gets worse and does not go away after a few minutes of rest.  You have an attack of chest pain lasting longer than what you usually experience.  You have significant dizziness, if you pass out, or have trouble walking.  You have chest pain not typical of your usual pain for which you originally saw your caregiver.  You have any other emergent concerns regarding your health.  Additional Information: Chest pain comes from many different causes. Your caregiver has diagnosed you as having chest pain that is not specific for one problem, but does not require admission.  You are at low risk for an acute heart condition or other serious  illness.   Your vital signs today were: BP (!) 106/58 (BP Location: Right Arm)   Pulse (!) 58   Temp 98.2 F (36.8 C) (Oral)   Resp 14   LMP 04/12/2024 (Approximate)   SpO2 100%  If your blood pressure (BP) was elevated above 135/85 this visit, please have this repeated by your doctor within one month. --------------

## 2024-05-02 NOTE — ED Provider Notes (Signed)
 [place] EMERGENCY DEPARTMENT AT [facility] Provider Note   CSN: [id number] Arrival date & time: 05/01/24  2143     Patient presents with: Chest Pain   Alexandra Huerta is a 33 y.o. female.   Patient with history of migraine --presents to the emergency department today for evaluation of nausea and vomiting, chest pain.  Patient began having symptoms that over 24 hours ago.  She describes initially had pain in her abdomen.  This was associated with nausea with vomiting.  Patient presented to the emergency department and had labs drawn but left prior to being seen.  She went to urgent care.  There she was given Zofran .  Symptoms persisted after that visit, prompting presentation to the emergency department last night.  Patient has been waiting for 9 hours.  She currently complains of a sharp pain in her mid chest.  It is not made worse with deep breathing.  She does not have associated shortness of breath unless the pain is severe.  No cough or fevers.  Pain does not radiate.  Abdominal pain that she had previously has resolved.  She was given tylenol  earlier here but states that she threw up.  No urinary symptoms.  No blood in the stool.  Denies sick contacts. Patient denies risk factors for pulmonary embolism including: unilateral leg swelling, history of DVT/PE/other blood clots, use of exogenous hormones, recent immobilizations, recent surgery, recent travel (>4hr segment), malignancy, hemoptysis.          Prior to Admission medications   Medication Sig Start Date End Date Taking? Authorizing Provider  ondansetron  (ZOFRAN -ODT) 4 MG disintegrating tablet Take 1 tablet (4 mg total) by mouth every 8 (eight) hours as needed for nausea or vomiting. 05/01/24   Enedelia Dorna HERO, FNP    Allergies: Clindamycin/lincomycin, Penicillins, and Latex    Review of Systems  Updated Vital Signs BP (!) 135/96 (BP Location: Left Arm)   Pulse 62   Temp 98.4 F (36.9 C) (Oral)   Resp 16    LMP 04/12/2024 (Approximate)   SpO2 100%   Physical Exam Vitals and nursing note reviewed.  Constitutional:      General: She is not in acute distress.    Appearance: She is well-developed. She is not diaphoretic.  HENT:     Head: Normocephalic and atraumatic.     Right Ear: External ear normal.     Left Ear: External ear normal.     Nose: Nose normal.     Mouth/Throat:     Mouth: Mucous membranes are not dry.   Eyes:     Conjunctiva/sclera: Conjunctivae normal.   Neck:     Vascular: Normal carotid pulses. No JVD.     Trachea: Trachea normal. No tracheal deviation.   Cardiovascular:     Rate and Rhythm: Normal rate and regular rhythm.     Pulses: No decreased pulses.          Radial pulses are 2+ on the right side and 2+ on the left side.     Heart sounds: Normal heart sounds, S1 normal and S2 normal. No murmur heard. Pulmonary:     Effort: Pulmonary effort is normal. No respiratory distress.     Breath sounds: No wheezing, rhonchi or rales.  Chest:     Chest wall: No tenderness.  Abdominal:     General: Bowel sounds are normal.     Palpations: Abdomen is soft.     Tenderness: There is no abdominal tenderness. There  is no guarding or rebound.   Musculoskeletal:        General: Normal range of motion.     Cervical back: Normal range of motion and neck supple. No muscular tenderness.     Right lower leg: No edema.     Left lower leg: No edema.   Skin:    General: Skin is warm and dry.     Coloration: Skin is not pale.     Findings: No rash.   Neurological:     General: No focal deficit present.     Mental Status: She is alert. Mental status is at baseline.     Motor: No weakness.   Psychiatric:        Mood and Affect: Mood normal.     (all labs ordered are listed, but only abnormal results are displayed) Labs Reviewed  BASIC METABOLIC PANEL WITH GFR - Abnormal; Notable for the following components:      Result Value   Sodium 134 (*)    All other  components within normal limits  CBC - Abnormal; Notable for the following components:   WBC 10.8 (*)    Hemoglobin 11.7 (*)    MCV 79.3 (*)    MCH 25.5 (*)    All other components within normal limits  TROPONIN I (HIGH SENSITIVITY)  TROPONIN I (HIGH SENSITIVITY)    EKG: EKG Interpretation Date/Time:  Wednesday May 01 2024 22:09:52 EDT Ventricular Rate:  58 PR Interval:  112 QRS Duration:  80 QT Interval:  428 QTC Calculation: 420 R Axis:   55  Text Interpretation: Sinus bradycardia Nonspecific T wave abnormality Abnormal ECG When compared with ECG of 01-May-2024 12:04, PREVIOUS ECG IS PRESENT Confirmed by Bari Pfeiffer ([id number]) on 05/02/2024 5:51:30 AM  Radiology: [facility] Chest Port 1 View Result Date: 05/01/2024 CLINICAL DATA:  Chest pain EXAM: PORTABLE CHEST 1 VIEW COMPARISON:  11/03/2022 FINDINGS: The heart size and mediastinal contours are within normal limits. Both lungs are clear. The visualized skeletal structures are unremarkable. IMPRESSION: No active disease. Electronically Signed   By: Franky Crease M.D.   On: 05/01/2024 22:26     Procedures   Medications Ordered in the ED  sodium chloride  0.9 % bolus 1,000 mL (has no administration in time range)  ondansetron  (ZOFRAN ) injection 4 mg (has no administration in time range)  pantoprazole  (PROTONIX ) injection 40 mg (has no administration in time range)  acetaminophen  (TYLENOL ) tablet 650 mg (650 mg Oral Given 05/02/24 0252)   ED Course  Patient seen and examined. History obtained directly from patient. Work-up including labs, imaging, EKG ordered in triage, if performed, were reviewed.  I reviewed labs performed at previous ED visit, urgent care note.  Labs/EKG: Independently reviewed and interpreted.  This included: CBC with minimally elevated white blood cell count of 10.8, hemoglobin 11.7 slightly low microcytic; BMP unremarkable; troponin 2 > 2.  EKG slightly changed with T wave abnormality, however compared to  previous EKG from 2 222, T wave abnormality present then.  I reviewed lab work performed yesterday at 12:07 PM including CBC showing normal white blood cell count, mild anemia; CMP which was unremarkable; lipase which was normal; pregnancy which was negative, troponin which is normal.  I reviewed pregnancy test checked at urgent care that which is negative.  Imaging: Independently visualized and interpreted.  This included: Chest x-ray agree negative  Medications/Fluids: Ordered: IV fluid bolus, IV Zofran , IV Protonix   Most recent vital signs reviewed and are as follows: BP ROLLEN)  135/96 (BP Location: Left Arm)   Pulse 62   Temp 98.4 F (36.9 C) (Oral)   Resp 16   LMP 04/12/2024 (Approximate)   SpO2 100%   Initial impression: Chest pain, setting of nausea and vomiting.  Reassuring abdominal exam.  Reassuring labs over the past 24 hours.  Patient looks well, nontoxic.  Will attempt to control symptoms better.  9:30 AM Reassessment performed. Patient appears stable, comfortable.  Patient was sleeping during several rechecks.  Currently talking on the phone.  She has tolerated fluids without vomiting.  Reviewed pertinent lab work and imaging with patient at bedside. Questions answered.   Most current vital signs reviewed and are as follows: BP (!) 106/58 (BP Location: Right Arm)   Pulse (!) 58   Temp 98.2 F (36.8 C) (Oral)   Resp 14   LMP 04/12/2024 (Approximate)   SpO2 100%   Plan: Discharge to home.   Prescriptions written for: Pepcid .  She was previously prescribed Zofran .  Other home care instructions discussed: Benjamine diet, slow advancement.  ED return instructions discussed: The patient was urged to return to the Emergency Department immediately with worsening of current symptoms, worsening abdominal pain, persistent vomiting, blood noted in stools, fever, or any other concerns.   I encouraged patient to return to ED with severe chest pain, especially if the pain is crushing  or pressure-like and spreads to the arms, back, neck, or jaw, or if they have associated sweating, vomiting, or shortness of breath with the pain, or significant pain with activity. We discussed that the evaluation here today indicates a low-risk of serious cause of chest pain, including heart trouble or a blood clot, but no evaluation is perfect and chest pain can evolve with time. The patient verbalized understanding and agreed.  I encouraged patient to follow-up with their provider in the next 48 hours for recheck.    Follow-up instructions discussed: Patient encouraged to follow-up with their PCP in 3 days.                                     Medical Decision Making Risk OTC drugs. Prescription drug management.   For this patient's complaint of chest pain, the following emergent conditions were considered on the differential diagnosis: acute coronary syndrome, pulmonary embolism, pneumothorax, myocarditis, pericardial tamponade, aortic dissection, thoracic aortic aneurysm complication, esophageal perforation.   Other causes were also considered including: gastroesophageal reflux disease, musculoskeletal pain including costochondritis, pneumonia/pleurisy, herpes zoster, pericarditis.  In regards to possibility of ACS, patient has atypical features of pain, non-ischemic and unchanged EKG and negative troponin(s). Heart score was calculated to be 2.   In regards to possibility of PE, symptoms are atypical for PE and risk profile is low, making PE low likelihood.  No clinical signs of DVT.  For this patient's complaint of NV, the following conditions were considered on the differential diagnosis: gastritis/PUD, enteritis/duodenitis, appendicitis, cholelithiasis/cholecystitis, cholangitis, pancreatitis, ruptured viscus, colitis, diverticulitis, small/large bowel obstruction, proctitis, cystitis, pyelonephritis, ureteral colic, aortic dissection, aortic aneurysm. In women, ectopic pregnancy,  pelvic inflammatory disease, ovarian cysts, and tubo-ovarian abscess were also considered. Atypical chest etiologies were also considered including ACS, PE, and pneumonia.  Patient's labs are very reassuring.  No abdominal pain at time of exam.  Indications for advanced imaging at this time.  The patient's vital signs, pertinent lab work and imaging were reviewed and interpreted as discussed in the ED course. Hospitalization  was considered for further testing, treatments, or serial exams/observation. However as patient is well-appearing, has a stable exam, and reassuring studies today, I do not feel that they warrant admission at this time. This plan was discussed with the patient who verbalizes agreement and comfort with this plan and seems reliable and able to return to the Emergency Department with worsening or changing symptoms.       Final diagnoses:  Nausea and vomiting, unspecified vomiting type  Chest pain, unspecified type    ED Discharge Orders          Ordered    famotidine  (PEPCID ) 20 MG tablet  2 times daily        05/02/24 0927               Desiderio Chew, PA-C 05/02/24 0932    Doretha Folks, MD 05/03/24 1038

## 2024-05-05 ENCOUNTER — Inpatient Hospital Stay
Admit: 2024-05-05 | Discharge: 2024-05-05 | Disposition: A | Discharge status: Home / Self Care | Payer: MEDICAID | Arrived: VH

## 2024-05-05 ENCOUNTER — Emergency Department: Admit: 2024-05-05 | Payer: MEDICAID

## 2024-05-05 DIAGNOSIS — R0789 Other chest pain: Principal | ICD-10-CM

## 2024-05-05 DIAGNOSIS — R079 Chest pain, unspecified: Principal | ICD-10-CM

## 2024-05-05 LAB — CBC WITH AUTO DIFFERENTIAL
Basophils %: 0.5 % (ref 0.0–1.0)
Basophils Absolute: 0.05 10*3/uL (ref 0.00–0.10)
Eosinophils %: 0.2 % (ref 0.0–7.0)
Eosinophils Absolute: 0.02 10*3/uL (ref 0.00–0.40)
Hematocrit: 35.7 % (ref 35.0–47.0)
Hemoglobin: 11.3 g/dL — ABNORMAL LOW (ref 11.5–16.0)
Immature Granulocytes %: 0.4 % (ref 0.0–0.5)
Immature Granulocytes Absolute: 0.04 10*3/uL (ref 0.00–0.04)
Lymphocytes %: 19.2 % (ref 12.0–49.0)
Lymphocytes Absolute: 1.88 10*3/uL (ref 0.80–3.50)
MCH: 25.5 pg — ABNORMAL LOW (ref 26.0–34.0)
MCHC: 31.7 g/dL (ref 30.0–36.5)
MCV: 80.6 FL (ref 80.0–99.0)
MPV: 8.9 FL (ref 8.9–12.9)
Monocytes %: 5.8 % (ref 5.0–13.0)
Monocytes Absolute: 0.57 10*3/uL (ref 0.00–1.00)
Neutrophils %: 73.9 % (ref 32.0–75.0)
Neutrophils Absolute: 7.23 10*3/uL (ref 1.80–8.00)
Nucleated RBCs: 0 /100{WBCs}
Platelets: 320 10*3/uL (ref 150–400)
RBC: 4.43 M/uL (ref 3.80–5.20)
RDW: 15.1 % — ABNORMAL HIGH (ref 11.5–14.5)
WBC: 9.8 10*3/uL (ref 3.6–11.0)
nRBC: 0 10*3/uL (ref 0.00–0.01)

## 2024-05-05 LAB — COMPREHENSIVE METABOLIC PANEL
ALT: 13 U/L (ref 12–78)
AST: 13 U/L — ABNORMAL LOW (ref 15–37)
Albumin/Globulin Ratio: 1 — ABNORMAL LOW (ref 1.1–2.2)
Albumin: 3.7 g/dL (ref 3.5–5.0)
Alk Phosphatase: 61 U/L (ref 45–117)
Anion Gap: 6 mmol/L (ref 2–12)
BUN/Creatinine Ratio: 8 — ABNORMAL LOW (ref 12–20)
BUN: 8 mg/dL (ref 6–20)
CO2: 27 mmol/L (ref 21–32)
Calcium: 9.4 mg/dL (ref 8.5–10.1)
Chloride: 105 mmol/L (ref 97–108)
Creatinine: 0.96 mg/dL (ref 0.55–1.02)
Est, Glom Filt Rate: 80 mL/min/{1.73_m2} (ref >60)
Globulin: 3.7 g/dL (ref 2.0–4.0)
Glucose: 107 mg/dL — ABNORMAL HIGH (ref 65–100)
Potassium: 3.5 mmol/L (ref 3.5–5.1)
Sodium: 138 mmol/L (ref 136–145)
Total Bilirubin: 0.4 mg/dL (ref 0.2–1.0)
Total Protein: 7.4 g/dL (ref 6.4–8.2)

## 2024-05-05 LAB — TROPONIN: Troponin, High Sensitivity: <4 ng/L (ref 0–51)

## 2024-05-05 LAB — EKG 12-LEAD
Atrial Rate: 89 {beats}/min
Diagnosis: NORMAL
P Axis: 78 degrees
P-R Interval: 122 ms
Q-T Interval: 358 ms
QRS Duration: 80 ms
QTc Calculation (Bazett): 435 ms
R Axis: 80 degrees
T Axis: 42 degrees
Ventricular Rate: 89 {beats}/min

## 2024-05-05 LAB — POC PREGNANCY UR-QUAL: Preg Test, Ur: NEGATIVE

## 2024-05-05 LAB — D-DIMER, QUANTITATIVE: D-Dimer, Quant: 1.09 mg{FEU}/L — ABNORMAL HIGH (ref 0.00–0.65)

## 2024-05-05 MED ORDER — IBUPROFEN 600 MG PO TABS
600 | ORAL_TABLET | Freq: Three times a day (TID) | ORAL | 0 refills | 10.00000 days | Status: AC | PRN
Start: 2024-05-05 — End: ?

## 2024-05-05 MED ORDER — IOPAMIDOL 76 % IV SOLN
76 | Freq: Once | INTRAVENOUS | Status: AC | PRN
Start: 2024-05-05 — End: 2024-05-05
  Administered 2024-05-05: 18:00:00 100 mL via INTRAVENOUS

## 2024-05-05 NOTE — ED Provider Notes (Signed)
 EKG: Interpreted by me, NSR with rate of 89, non-specific T wave abnl, no STEMI.  Lonni Birder Birder Lonni KATHEE, MD  06/03/24 [phone]

## 2024-05-05 NOTE — ED Provider Notes (Signed)
 [facility]  EMERGENCY DEPARTMENT ENCOUNTER       Pt Name: Amanda Little  MRN: [id number]  Birthdate 1991-07-11  Date of evaluation: 05/05/2024  Provider: Artist Molt, PA   PCP: No primary care provider on file.  Note Started: 12:16 PM EDT 05/05/24     CHIEF COMPLAINT       Chief Complaint   Patient presents with    Chest Pain     PT arrives ambulatory to triage with report of severe chest pains since Tuesday. Pt reports she was seen at an outside ED Tuesday and has a normal workup. Pt reports pain has not subsided.         HISTORY OF PRESENT ILLNESS: 1 or more elements      History From: Patient  HPI Limitations: None     Amanda Little is a 33 y.o. female who presents ambulatory with her husband with 5 days of essentially constant anterior chest pain.  Symptoms are not provoked by exertion and did not improve with rest.  She has had no nausea or vomiting.  She has no history of cardiac disease.  She and her husband tell me they are visiting family here in [place] and expect to return to [facility] [place]  tomorrow.  She takes no medications daily and has no medication allergies.  Unfortunately, she does smoke cigarettes.     Nursing Notes were all reviewed and agreed with or any disagreements were addressed in the HPI.     REVIEW OF SYSTEMS      Review of Systems     Positives and Pertinent negatives as per HPI.    PAST HISTORY     Past Medical History:  No past medical history on file.    Past Surgical History:  No past surgical history on file.    Family History:  No family history on file.    Social History:       Allergies:  No Known Allergies    CURRENT MEDICATIONS      Previous Medications    No medications on file       SCREENINGS        History: 0  ECG: 0  Patient Age: 33  *Risk factors for Atherosclerotic disease: Cigarette smoking  Risk Factors: 1  Troponin: 0  Heart Score Total: 1        No data recorded        PHYSICAL EXAM      ED Triage Vitals   Encounter Vitals Group       BP 05/05/24 1108 130/72      Systolic BP Percentile --       Diastolic BP Percentile --       Pulse 05/05/24 1108 99      Respirations 05/05/24 1108 17      Temp 05/05/24 1108 98.2 F (36.8 C)      Temp Source 05/05/24 1108 Oral      SpO2 05/05/24 1108 100 %      Weight - Scale 05/05/24 1100 59 kg (130 lb 1.1 oz)      Height 05/05/24 1100 1.524 m (5')      Head Circumference --       Peak Flow --       Pain Score --       Pain Loc --       Pain Education --       Exclude from Growth Chart --  Physical Exam  Vitals and nursing note reviewed.   Constitutional:       General: She is not in acute distress.  HENT:      Head: Normocephalic.      Nose: Nose normal.      Mouth/Throat:      Mouth: Mucous membranes are moist.   Eyes:      General: Vision grossly intact.   Cardiovascular:      Rate and Rhythm: Normal rate.      Comments:   Symmetric upper extremity distal pulses  No lower extremity edema  No calf tenderness  Symmetric DP pulses  Pulmonary:      Effort: Pulmonary effort is normal.   Chest:      Chest wall: Tenderness present.          Comments:   Symmetric, full chest wall expansion with deep inspiration.   Breathing unlabored; lungs are clear  No bruising, redness or swelling  Anterior chest wall rib tenderness with palpation  Abdominal:      Palpations: Abdomen is soft.   Neurological:      Mental Status: She is alert and oriented to person, place, and time.   Psychiatric:         Mood and Affect: Mood normal.          DIAGNOSTIC RESULTS   LABS:     Recent Results (from the past 24 hours)   EKG 12 Lead    Collection Time: 05/05/24 11:11 AM   Result Value Ref Range    Ventricular Rate 89 BPM    Atrial Rate 89 BPM    P-R Interval 122 ms    QRS Duration 80 ms    Q-T Interval 358 ms    QTc Calculation (Bazett) 435 ms    P Axis 78 degrees    R Axis 80 degrees    T Axis 42 degrees    Diagnosis       Normal sinus rhythm with sinus arrhythmia  Nonspecific T wave abnormality  Abnormal ECG  No previous ECGs  available  Confirmed by Alverda Rogue ([id number]) on 05/05/2024 2:23:42 PM     CBC with Auto Differential    Collection Time: 05/05/24 11:17 AM   Result Value Ref Range    WBC 9.8 3.6 - 11.0 K/uL    RBC 4.43 3.80 - 5.20 M/uL    Hemoglobin 11.3 (L) 11.5 - 16.0 g/dL    Hematocrit 64.2 64.9 - 47.0 %    MCV 80.6 80.0 - 99.0 FL    MCH 25.5 (L) 26.0 - 34.0 PG    MCHC 31.7 30.0 - 36.5 g/dL    RDW 84.8 (H) 88.4 - 14.5 %    Platelets 320 150 - 400 K/uL    MPV 8.9 8.9 - 12.9 FL    Nucleated RBCs 0.0 0 PER 100 WBC    nRBC 0.00 0.00 - 0.01 K/uL    Neutrophils % 73.9 32.0 - 75.0 %    Lymphocytes % 19.2 12.0 - 49.0 %    Monocytes % 5.8 5.0 - 13.0 %    Eosinophils % 0.2 0.0 - 7.0 %    Basophils % 0.5 0.0 - 1.0 %    Immature Granulocytes % 0.4 0.0 - 0.5 %    Neutrophils Absolute 7.23 1.80 - 8.00 K/UL    Lymphocytes Absolute 1.88 0.80 - 3.50 K/UL    Monocytes Absolute 0.57 0.00 - 1.00 K/UL    Eosinophils Absolute 0.02 0.00 -  0.40 K/UL    Basophils Absolute 0.05 0.00 - 0.10 K/UL    Immature Granulocytes Absolute 0.04 0.00 - 0.04 K/UL    Differential Type AUTOMATED     Troponin    Collection Time: 05/05/24 11:17 AM   Result Value Ref Range    Troponin, High Sensitivity <4 0 - 51 ng/L   Comprehensive Metabolic Panel    Collection Time: 05/05/24 11:17 AM   Result Value Ref Range    Sodium 138 136 - 145 mmol/L    Potassium 3.5 3.5 - 5.1 mmol/L    Chloride 105 97 - 108 mmol/L    CO2 27 21 - 32 mmol/L    Anion Gap 6 2 - 12 mmol/L    Glucose 107 (H) 65 - 100 mg/dL    BUN 8 6 - 20 MG/DL    Creatinine 9.03 9.44 - 1.02 MG/DL    BUN/Creatinine Ratio 8 (L) 12 - 20      Est, Glom Filt Rate 80 >60 ml/min/1.96m2    Calcium 9.4 8.5 - 10.1 MG/DL    Total Bilirubin 0.4 0.2 - 1.0 MG/DL    ALT 13 12 - 78 U/L    AST 13 (L) 15 - 37 U/L    Alk Phosphatase 61 45 - 117 U/L    Total Protein 7.4 6.4 - 8.2 g/dL    Albumin 3.7 3.5 - 5.0 g/dL    Globulin 3.7 2.0 - 4.0 g/dL    Albumin/Globulin Ratio 1.0 (L) 1.1 - 2.2     D-Dimer, Quantitative    Collection Time:  05/05/24 11:56 AM   Result Value Ref Range    D-Dimer, Quant 1.09 (H) 0.00 - 0.65 mg/L FEU   POC Pregnancy Urine Qual    Collection Time: 05/05/24  1:29 PM   Result Value Ref Range    Preg Test, Ur Negative NEG           EKG: When ordered, EKG's are interpreted by the Emergency Department Physician in the absence of a cardiologist.  Please see their note for interpretation of EKG.      RADIOLOGY:  Non-plain film images such as CT, Ultrasound and MRI are read by the radiologist. Plain radiographic images are visualized and preliminarily interpreted by the ED Provider with the below findings:    Interpretation per the Radiologist below, if available at the time of this note:     CTA CHEST W WO CONTRAST  Result Date: 05/05/2024  EXAM:  CTA CHEST W WO CONTRAST INDICATION: Chest pain, elevated D-dimer COMPARISON: None. TECHNIQUE: Helical thin section chest CT following intravenous administration of nonionic contrast 100 mL of isovue 370 according to departmental PE protocol. Coronal and sagittal reformats were performed. 3D post processing was performed.  CT dose reduction was achieved through the use of a standardized protocol tailored for this examination and automatic exposure control for dose modulation. FINDINGS: This is a good quality study for the evaluation of pulmonary embolism to the first subsegmental arterial level. There is no pulmonary embolism to this level. MEDIASTINUM: No mass or lymphadenopathy. HILA: No mass or lymphadenopathy. THORACIC AORTA: No aneurysm. HEART: Normal in size. ESOPHAGUS: No wall thickening or dilatation. TRACHEA/BRONCHI: Patent. PLEURA: No effusion or pneumothorax. LUNGS: 3 mm pulmonary nodule left lower lobe. No acute abnormality. UPPER ABDOMEN: Partially imaged. No acute pathology. BONES: No aggressive bone lesion or fracture.     No acute process or evidence of pulmonary embolism. Small pulmonary nodule left lower lobe. Guidelines for Management  of Incidental Pulmonary Nodules  Detected on CT Images: from the Fleischner Society 2017 LOW-RISK PATIENTS: LESS THAN OR EQUAL TO 6 MM:  No routine follow-up needed. GREATER THAN 6-8 MM: CT at 6-12 months, if multiple at 3-6 months, then consider CT at 18-24 months. GREATER THAN 8 MM:  Consider CT at 3 months, PET/CT, or tissue sampling. If multiple CT at 3-6 months, then consider CT at 18-24 months. HIGH-RISK PATIENTS: LESS THAN OR EQUAL TO 6 MM:  Optional CT at 12 months. GREATER THAN 6-8 MM: CT at 6-12 months, if multiple at 3-6 months, then CT at 18-24 months. GREATER THAN 8 MM:  Consider CT at 3 months, PET/CT, or tissue sampling. If multiple CT at 3-6 months, then at 18-24 months. Guidelines for Management of Incidental Pulmonary Nodules Detected on CT : A Statement from the Fleischner Society 2017  [place]. Radiology 2017; 000:1-16 Electronically signed by DARICE COLON    XR CHEST 1 VIEW  Result Date: 05/05/2024  EXAM: XR CHEST 1 VIEW INDICATION: Chest Pain COMPARISON: None. FINDINGS: A single view of the chest demonstrates clear lungs. The cardiac and mediastinal contours and pulmonary vascularity are normal. The bones and soft tissues are within normal limits.     Normal chest. Electronically signed by DARICE COLON       PROCEDURES   Unless otherwise noted below, none  Procedures     CRITICAL CARE TIME   Patient does not meet Critical Care Time, 0 minutes     EMERGENCY DEPARTMENT COURSE and DIFFERENTIAL DIAGNOSIS/MDM   Vitals:    Vitals:    05/05/24 1200 05/05/24 1215 05/05/24 1230 05/05/24 1245   BP: 112/79 121/80 115/83 122/85   Pulse: 78 74 67 66   Resp: 19 21 14 21    Temp:       TempSrc:       SpO2: 97% 99% 99% 98%   Weight:       Height:            Patient was given the following medications:  Medications   iopamidol (ISOVUE-370) 76 % injection 100 mL (100 mLs IntraVENous Given 05/05/24 1350)       CONSULTS: (Who and What was discussed)  None    Chronic Conditions:  has no past medical history on file.     Social  Determinants affecting Dx or Tx: None    Records Reviewed (source and summary of external records): Nursing Notes and Old Medical Records    Emergency department visits in [place]  dated 05/01/2024 with Horton, Charmaine FALCON, MD describing nausea and vomiting and chest pain is reviewed    CC/HPI Summary, DDx, ED Course, and Reassessment:      Amanda Little is a 33 y.o. female who presents ambulatory with her husband with 5 days of essentially constant anterior chest pain.  Symptoms are not provoked by exertion and did not improve with rest.  She has had no nausea or vomiting.  She has no history of cardiac disease.  She and her husband tell me they are visiting family here in [place] and expect to return to [facility] [place]  tomorrow.  She takes no medications daily and has no medication allergies.  Unfortunately, she does smoke cigarettes.    On exam, she is afebrile and well-appearing with normal vital signs  Lungs are clear  No calf pain or lower extremity edema  Belly is soft and nontender    Differential diagnosis includes ACS, PE,  pneumothorax, pneumonia, rib fracture, costochondritis, referred abdominal pain and other    Given acuity of symptoms, IV access is ordered  EKG to evaluate for arrhythmia and troponin back for ACS  Given low pretest probability, D-dimer is ordered to stratify risk for pulmonary embolism  CMP to evaluate renal function, electrolytes and transaminase  CBC to evaluate for anemia, leukocytosis or platelets  Chest x-ray to evaluate for acute process    CLINICAL MANAGEMENT TOOLS:  Not Applicable, Chest Pain: MEDICAL DECISION MAKING:   I considered, but did not perform, additional testing as well as admission or transfer to a higher level of care.     I utilized an evidence-based risk rating tool (CMT) along with my training and experience to weigh the risk of discharge against the risks of further testing, imaging, or hospitalization. At this time, I estimate the  risks of additional testing, imaging, or hospitalization to be equal to or greater than the risk of discharge(in the case of discharge home).      The patient's HEART Score is 0. In rare cases, I give patients with HEART Score of 4 the option of discharge, but only when they meet criteria for Low 4, meaning that HST was used, and the 4 is not from a highly suspicious story, highly suspicious EKG, or positive cardiac enzymes.  In these selected cases, the risk of a Low 4 is still most likely lower than the risk of admission and further testing/imaging. [id number]    SHARED DECISION MAKING:   I discussed my risk assessment with the patient. The patient understands and consents to the risk of disposition/plan, as well as the risk of uncertainty in estimating outcomes. [id number]          Heart Score: 1    ED Course as of 05/05/24 1432   Sun May 05, 2024   1249 EKG is nonischemic and troponin is not elevated.  Given constant duration of symptoms over the past 5 days, presentation is not consistent with ACS and serial troponin EKGs were not pursued. [EJ]   1249 D-Dimer, Quant(!): 1.09  D-dimer is elevated.  Will proceed with CTA of the chest to evaluate for acute pulmonary embolism. [EJ]   1347 Chest x-ray is clear.  CMP reflects normal renal function, normal electrolytes, normal bilirubin and no transaminitis.  CBC is without leukocytosis or platelet dysfunction.  Hematocrit is normal.  Hemoglobin is only slightly low at 11.3. [EJ]   1412 CTA is negative for PE or acute process.  Incidentally identified is a small pulmonary nodule.  Patient has a low heart score.  Labs and imaging are otherwise normal.  Given her chest wall tenderness, presentation may reflect acute costochondritis.  Will treat pain symptoms with ibuprofen.  She is scheduled to return to [facility]  I do offer a note for work for a couple of days.  She is urged to reach out to her primary care and/or cardiology once home.  [EJ]   1413 The patient tells me she smokes cigarettes and is urged to quit smoking    TOBACCO COUNSELING:  Spent 1-5 minutes discussing the risks of smoking and the benefits of smoking cessation as well as the long term sequelae of smoking with the pt who verbalized his understanding. Reviewed strategies for success, including gradually decreasing the number of cigarettes smoked a day. [EJ]      ED Course User Index  [EJ] Anetria Harwick E, PA       Disposition Considerations (Tests  not done, Shared Decision Making, Pt Expectation of Test or Tx.):      FINAL IMPRESSION     1. Acute chest pain    2. Acute chest wall pain    3. Smoking addiction          DISPOSITION/PLAN   DISPOSITION Decision To Discharge 05/05/2024 02:13:50 PM   DISPOSITION CONDITION Stable     Discharge Note: The patient is stable for discharge home. The signs, symptoms, diagnosis, and discharge instructions have been discussed, understanding conveyed, and agreed upon. The patient is to follow up as recommended or return to ER should their symptoms worsen.      PATIENT REFERRED TO:  Ronal Snow, MD  [facility]   [address] [place], [place] [address]   [phone]           DISCHARGE MEDICATIONS:     Medication List        START taking these medications      ibuprofen 600 MG tablet  Commonly known as: ADVIL;MOTRIN  Take 1 tablet by mouth every 8 hours as needed for Pain               Where to Get Your Medications        Information about where to get these medications is not yet available    Ask your nurse or doctor about these medications  ibuprofen 600 MG tablet           DISCONTINUED MEDICATIONS:  Current Discharge Medication List        I have seen and evaluated the patient autonomously. My supervision physician was on site and available for consultation if needed.     I am the Primary Clinician of Record.   Artist Molt, PA (electronically signed)    (Please note that parts of this dictation were completed with  voice recognition software. Quite often unanticipated grammatical, syntax, homophones, and other interpretive errors are inadvertently transcribed by the computer software. Please disregards these errors. Please excuse any errors that have escaped final proofreading.)       Laurabeth, Yip, [place]  05/05/24 1432

## 2024-05-05 NOTE — Discharge Instructions (Signed)
 Thank You!    It was a pleasure taking care of you in our Emergency Department today. We know that when you come to our Emergency Department, you are entrusting us  with your health, comfort, and safety. Our physicians and nurses honor that trust, and truly appreciate the opportunity to care for you and your loved ones.      We also value your feedback. If you receive a survey about your Emergency Department experience today, please fill it out.  We care about our patients' feedback, and we listen to what you have to say.  Thank you.    Caileb Rhue E. Glyndon Tursi, PA-C      ________________________________________________________________________  I have included a copy of your lab results and/or radiologic studies from today's visit so you can have them easily available at your follow-up visit. We hope you feel better and please do not hesitate to contact the ED if you have any questions at all!    Recent Results (from the past 12 hours)   EKG 12 Lead    Collection Time: 05/05/24 11:11 AM   Result Value Ref Range    Ventricular Rate 89 BPM    Atrial Rate 89 BPM    P-R Interval 122 ms    QRS Duration 80 ms    Q-T Interval 358 ms    QTc Calculation (Bazett) 435 ms    P Axis 78 degrees    R Axis 80 degrees    T Axis 42 degrees    Diagnosis       Normal sinus rhythm with sinus arrhythmia  Nonspecific T wave abnormality  Abnormal ECG  No previous ECGs available     CBC with Auto Differential    Collection Time: 05/05/24 11:17 AM   Result Value Ref Range    WBC 9.8 3.6 - 11.0 K/uL    RBC 4.43 3.80 - 5.20 M/uL    Hemoglobin 11.3 (L) 11.5 - 16.0 g/dL    Hematocrit 64.2 64.9 - 47.0 %    MCV 80.6 80.0 - 99.0 FL    MCH 25.5 (L) 26.0 - 34.0 PG    MCHC 31.7 30.0 - 36.5 g/dL    RDW 84.8 (H) 88.4 - 14.5 %    Platelets 320 150 - 400 K/uL    MPV 8.9 8.9 - 12.9 FL    Nucleated RBCs 0.0 0 PER 100 WBC    nRBC 0.00 0.00 - 0.01 K/uL    Neutrophils % 73.9 32.0 - 75.0 %    Lymphocytes % 19.2 12.0 - 49.0 %    Monocytes % 5.8 5.0 - 13.0 %    Eosinophils %  0.2 0.0 - 7.0 %    Basophils % 0.5 0.0 - 1.0 %    Immature Granulocytes % 0.4 0.0 - 0.5 %    Neutrophils Absolute 7.23 1.80 - 8.00 K/UL    Lymphocytes Absolute 1.88 0.80 - 3.50 K/UL    Monocytes Absolute 0.57 0.00 - 1.00 K/UL    Eosinophils Absolute 0.02 0.00 - 0.40 K/UL    Basophils Absolute 0.05 0.00 - 0.10 K/UL    Immature Granulocytes Absolute 0.04 0.00 - 0.04 K/UL    Differential Type AUTOMATED     Troponin    Collection Time: 05/05/24 11:17 AM   Result Value Ref Range    Troponin, High Sensitivity <4 0 - 51 ng/L   Comprehensive Metabolic Panel    Collection Time: 05/05/24 11:17 AM   Result Value Ref Range  Sodium 138 136 - 145 mmol/L    Potassium 3.5 3.5 - 5.1 mmol/L    Chloride 105 97 - 108 mmol/L    CO2 27 21 - 32 mmol/L    Anion Gap 6 2 - 12 mmol/L    Glucose 107 (H) 65 - 100 mg/dL    BUN 8 6 - 20 MG/DL    Creatinine 9.03 9.44 - 1.02 MG/DL    BUN/Creatinine Ratio 8 (L) 12 - 20      Est, Glom Filt Rate 80 >60 ml/min/1.42m2    Calcium 9.4 8.5 - 10.1 MG/DL    Total Bilirubin 0.4 0.2 - 1.0 MG/DL    ALT 13 12 - 78 U/L    AST 13 (L) 15 - 37 U/L    Alk Phosphatase 61 45 - 117 U/L    Total Protein 7.4 6.4 - 8.2 g/dL    Albumin 3.7 3.5 - 5.0 g/dL    Globulin 3.7 2.0 - 4.0 g/dL    Albumin/Globulin Ratio 1.0 (L) 1.1 - 2.2     D-Dimer, Quantitative    Collection Time: 05/05/24 11:56 AM   Result Value Ref Range    D-Dimer, Quant 1.09 (H) 0.00 - 0.65 mg/L FEU   POC Pregnancy Urine Qual    Collection Time: 05/05/24  1:29 PM   Result Value Ref Range    Preg Test, Ur Negative NEG         CTA CHEST W WO CONTRAST   Final Result   No acute process or evidence of pulmonary embolism. Small pulmonary nodule left   lower lobe.      Guidelines for Management of Incidental Pulmonary Nodules Detected on CT Images:   from the Fleischner Society 2017      LOW-RISK PATIENTS:      LESS THAN OR EQUAL TO 6 MM:  No routine follow-up needed.   GREATER THAN 6-8 MM: CT at 6-12 months, if multiple at 3-6 months, then consider   CT at 18-24  months.   GREATER THAN 8 MM:  Consider CT at 3 months, PET/CT, or tissue sampling. If   multiple CT at 3-6 months, then consider CT at 18-24 months.         HIGH-RISK PATIENTS:      LESS THAN OR EQUAL TO 6 MM:  Optional CT at 12 months.   GREATER THAN 6-8 MM: CT at 6-12 months, if multiple at 3-6 months, then CT at   18-24 months.   GREATER THAN 8 MM:  Consider CT at 3 months, PET/CT, or tissue sampling. If   multiple CT at 3-6 months, then at 18-24 months.      Guidelines for Management of Incidental Pulmonary Nodules Detected on CT : A   Statement from the Fleischner Society 2017  [place]. Radiology   2017; 000:1-16               Electronically signed by DARICE COLON      XR CHEST 1 VIEW   Final Result   Normal chest.      Electronically signed by DARICE COLON        @CT48 @  The exam and treatment you received in the Emergency Department were for an urgent problem and are not intended as complete care. It is important that you follow up with a doctor, nurse practitioner, or physician assistant for ongoing care. If your symptoms become worse or you do not improve as expected and you are unable  to reach your usual health care provider, you should return to the Emergency Department. We are available 24 hours a day.    Please take your discharge instructions with you when you go to your follow-up appointment.     If a prescription has been provided, please have it filled as soon as possible to prevent a delay in treatment. Read the entire medication instruction sheet provided to you by the pharmacy. If you have any questions or reservations about taking the medication due to side effects or interactions with other medications, please call your primary care physician or contact the ER to speak with the charge nurse.     Please make an appointment with your family doctor or the physician you were referred to for follow-up of this visit as instructed on your discharge paperwork. Return to the ER if you are  unable to be seen or if you are unable to be seen in a timely manner.    Should you experience abdominal pain lasting greater than 6 hours, chest pain, difficulty breathing, fever/chills, numbness/tingling, skin changes or other symptoms that concern you, return to the ED sooner. If you feel worse over the next 24 hours, please return to the ED. We are available 24 hours a day. Thank you for trusting us  with your care!

## 2024-06-05 ENCOUNTER — Other Ambulatory Visit: Payer: Self-pay | Admitting: Medical Genetics

## 2024-06-12 ENCOUNTER — Ambulatory Visit
Admission: RE | Admit: 2024-06-12 | Discharge: 2024-06-12 | Disposition: A | Discharge status: Home / Self Care | Source: Ambulatory Visit | Attending: Internal Medicine | Admitting: Internal Medicine

## 2024-06-12 ENCOUNTER — Other Ambulatory Visit: Payer: Self-pay | Admitting: Internal Medicine

## 2024-06-12 DIAGNOSIS — N6311 Unspecified lump in the right breast, upper outer quadrant: Secondary | ICD-10-CM

## 2024-06-12 DIAGNOSIS — N6310 Unspecified lump in the right breast, unspecified quadrant: Secondary | ICD-10-CM

## 2024-06-14 ENCOUNTER — Ambulatory Visit
Admission: RE | Admit: 2024-06-14 | Discharge: 2024-06-14 | Disposition: A | Discharge status: Home / Self Care | Source: Ambulatory Visit | Attending: Internal Medicine | Admitting: Internal Medicine

## 2024-06-14 DIAGNOSIS — N6310 Unspecified lump in the right breast, unspecified quadrant: Secondary | ICD-10-CM

## 2024-06-14 HISTORY — PX: BREAST BIOPSY: SHX20

## 2024-06-17 LAB — SURGICAL PATHOLOGY

## 2024-07-29 ENCOUNTER — Other Ambulatory Visit: Payer: Self-pay | Admitting: Surgery

## 2024-08-16 ENCOUNTER — Other Ambulatory Visit: Payer: Self-pay | Admitting: Medical Genetics

## 2024-08-16 DIAGNOSIS — Z006 Encounter for examination for normal comparison and control in clinical research program: Secondary | ICD-10-CM

## 2024-08-19 ENCOUNTER — Other Ambulatory Visit: Payer: Self-pay

## 2024-08-19 ENCOUNTER — Encounter (HOSPITAL_BASED_OUTPATIENT_CLINIC_OR_DEPARTMENT_OTHER): Payer: Self-pay | Admitting: Surgery

## 2024-08-23 NOTE — Progress Notes (Signed)

## 2024-08-25 NOTE — H&P (Signed)
 PROVIDER: VICENTA DASIE POLI, MD  MRN: [id number] DOB: 02-12-1991  Subjective   Chief Complaint: NEW PROBLEM - rt breast benign fibroadenoma   History of Present Illness: Alexandra Huerta is a 33 y.o. female who is seen for evaluation of a right breast mass. I saw her last year when she had 2 biopsies in the left breast there were discordant with worrisome findings on her imaging. She underwent a lumpectomy with 2 seeds in place and there was no malignancy found. At that point she had a small mass which was likely fibroadenoma in the right breast which was asymptomatic but is now been causing discomfort. She underwent imaging showing the 2.1 cm mass in the upper outer quadrant of the right breast. Biopsy showed a complex fibroadenoma. Surgical excision was recommended. Again, there is no family history of breast cancer. She has no issues regarding general anesthesia and has otherwise been doing well    Review of Systems: A complete review of systems was obtained from the patient. I have reviewed this information and discussed as appropriate with the patient. See HPI as well for other ROS.  ROS   Medical History: Past Medical History:  Diagnosis Date  Anemia   There is no problem list on file for this patient.  History reviewed. No pertinent surgical history.   Allergies  Allergen Reactions  Amoxicillin Itching   Current Outpatient Medications on File Prior to Visit  Medication Sig Dispense Refill  oxyCODONE  (ROXICODONE ) 5 MG immediate release tablet Take 1 tablet (5 mg total) by mouth every 4 (four) hours as needed for Pain 20 tablet 0   No current facility-administered medications on file prior to visit.   Family History  Problem Relation Age of Onset  High blood pressure (Hypertension) Father    Social History   Tobacco Use  Smoking Status Never  Smokeless Tobacco Never    Social History   Socioeconomic History  Marital status: Married  Tobacco Use  Smoking  status: Never  Smokeless tobacco: Never  Vaping Use  Vaping status: Never Used  Substance and Sexual Activity  Alcohol use: Not Currently  Drug use: Never   Social Drivers of Corporate investment banker Strain: Not on File (02/24/2022)  Received from [employer]  Financial Resource Strain: 0  Food Insecurity: Not at Risk (03/20/2024)  Received from [employer] Insecurity  Within the past 12 months, the food you bought just didn't last and you didn't have enough money to get more.: 1  Transportation Needs: Not at Risk (03/20/2024)  Received from [employer] Needs  In the past 12 months, has lack of transportation kept you from medical appointments, meetings, work or from getting things needed for daily living? (Check all that apply): 1  Physical Activity: Not on File (02/24/2022)  Received from [facility]  Physical Activity  Physical Activity: 0  Stress: Not on File (02/24/2022)  Received from [facility]  Stress  Stress: 0  Social Connections: Not on File (07/25/2023)  Received from [facility]  Social Connections  Connectedness: 0  Housing Stability: At Risk (03/20/2024)  Received from [facility]  Housing Stability  What is your housing situation today? : 2   Objective:   Vitals:  07/29/24 1513 07/29/24 1514  BP: 108/73  Pulse: 78  Temp: 36.7 C (98.1 F)  Weight: 56.3 kg (124 lb 3.2 oz)  Height: 152.4 cm (5')  PainSc: 0-No pain   Body mass index is 24.26 kg/m.  Physical Exam  A chaperone was present for the exam  In the upper outer quadrant of the right breast there is a 2.1 cm mobile mass at approximately 10:00 5 cm from the nipple. There are no other palpable breast masses and no axillary adenopathy  Labs, Imaging and Diagnostic Testing:  I have reviewed the ultrasound and mammograms. Again there is the mass in the upper outer quadrant of the right breast measuring 2.1 cm. There are 2 smaller masses in her breast which are likely benign as well.  These are not palpable.  Assessment and Plan:   Diagnoses and all orders for this visit:  Mass of upper outer quadrant of right breast    I have reviewed the pathologic findings with her. She is symptomatic from the right breast mass for ports that it is getting larger and uncomfortable therefore she requests surgical removal of this which I feel is very reasonable given the complexity of it. She was still likely needing preimaging in 6 months for the remaining masses which are not palpable but are also likely benign. I believe we should go ahead and remove the palpable mass for complete histologic evaluation to rule out a sarcoma of the breast. She also would like it removed as well. We discussed the risk of surgery which includes but is not limited to bleeding, infection, the need for further surgery malignancy is found, cardiopulmonary issues with anesthesia, postoperative recovery, excetra. Surgery will be scheduled

## 2024-08-26 ENCOUNTER — Ambulatory Visit (HOSPITAL_BASED_OUTPATIENT_CLINIC_OR_DEPARTMENT_OTHER): Admitting: Anesthesiology

## 2024-08-26 ENCOUNTER — Ambulatory Visit (HOSPITAL_BASED_OUTPATIENT_CLINIC_OR_DEPARTMENT_OTHER)
Admission: RE | Admit: 2024-08-26 | Discharge: 2024-08-26 | Disposition: A | Discharge status: Home / Self Care | Attending: Surgery | Admitting: Surgery

## 2024-08-26 ENCOUNTER — Encounter (HOSPITAL_BASED_OUTPATIENT_CLINIC_OR_DEPARTMENT_OTHER): Payer: Self-pay | Admitting: Surgery

## 2024-08-26 ENCOUNTER — Other Ambulatory Visit: Payer: Self-pay

## 2024-08-26 ENCOUNTER — Encounter (HOSPITAL_BASED_OUTPATIENT_CLINIC_OR_DEPARTMENT_OTHER)
Admission: RE | Disposition: A | Discharge status: Home / Self Care | Payer: Self-pay | Source: Home / Self Care | Attending: Surgery

## 2024-08-26 DIAGNOSIS — N6310 Unspecified lump in the right breast, unspecified quadrant: Secondary | ICD-10-CM

## 2024-08-26 DIAGNOSIS — D241 Benign neoplasm of right breast: Secondary | ICD-10-CM | POA: Insufficient documentation

## 2024-08-26 DIAGNOSIS — R519 Headache, unspecified: Secondary | ICD-10-CM | POA: Insufficient documentation

## 2024-08-26 DIAGNOSIS — Z01818 Encounter for other preprocedural examination: Secondary | ICD-10-CM

## 2024-08-26 DIAGNOSIS — N6001 Solitary cyst of right breast: Secondary | ICD-10-CM | POA: Diagnosis not present

## 2024-08-26 LAB — POCT PREGNANCY, URINE: Preg Test, Ur: NEGATIVE

## 2024-08-26 SURGERY — EXCISION, MASS, BREAST
Anesthesia: General | Site: Breast | Laterality: Right

## 2024-08-26 MED ORDER — DEXAMETHASONE SODIUM PHOSPHATE 4 MG/ML IJ SOLN
INTRAMUSCULAR | Status: DC | PRN
  Administered 2024-08-26: 8 mg via INTRAVENOUS

## 2024-08-26 MED ORDER — MIDAZOLAM HCL 2 MG/2ML IJ SOLN
INTRAMUSCULAR | Status: AC
  Filled 2024-08-26: qty 2

## 2024-08-26 MED ORDER — AMISULPRIDE (ANTIEMETIC) 5 MG/2ML IV SOLN: 10.0000 mg | Freq: Once | INTRAVENOUS | Status: DC | PRN

## 2024-08-26 MED ORDER — OXYCODONE HCL 5 MG/5ML PO SOLN: 5.0000 mg | Freq: Once | ORAL | Status: DC | PRN

## 2024-08-26 MED ORDER — KETOROLAC TROMETHAMINE 30 MG/ML IJ SOLN
INTRAMUSCULAR | Status: DC | PRN
  Administered 2024-08-26: 15 mg via INTRAVENOUS

## 2024-08-26 MED ORDER — HYDROMORPHONE HCL 1 MG/ML IJ SOLN: 0.2500 mg | INTRAMUSCULAR | Status: DC | PRN

## 2024-08-26 MED ORDER — MIDAZOLAM HCL (PF) 2 MG/2ML IJ SOLN
INTRAMUSCULAR | Status: DC | PRN
  Administered 2024-08-26: 2 mg via INTRAVENOUS

## 2024-08-26 MED ORDER — LIDOCAINE HCL (CARDIAC) PF 100 MG/5ML IV SOSY
PREFILLED_SYRINGE | INTRAVENOUS | Status: DC | PRN
  Administered 2024-08-26: 40 mg via INTRAVENOUS

## 2024-08-26 MED ORDER — CIPROFLOXACIN IN D5W 400 MG/200ML IV SOLN
INTRAVENOUS | Status: AC
  Filled 2024-08-26: qty 200

## 2024-08-26 MED ORDER — CIPROFLOXACIN IN D5W 400 MG/200ML IV SOLN
400.0000 mg | INTRAVENOUS | Status: AC
  Administered 2024-08-26: 400 mg via INTRAVENOUS

## 2024-08-26 MED ORDER — CHLORHEXIDINE GLUCONATE CLOTH 2 % EX PADS
6.0000 | MEDICATED_PAD | Freq: Once | CUTANEOUS | Status: DC
Start: 2024-08-26 — End: 2024-08-26

## 2024-08-26 MED ORDER — ONDANSETRON HCL 4 MG/2ML IJ SOLN
INTRAMUSCULAR | Status: DC | PRN
  Administered 2024-08-26: 4 mg via INTRAVENOUS

## 2024-08-26 MED ORDER — ACETAMINOPHEN 500 MG PO TABS
1000.0000 mg | ORAL_TABLET | ORAL | Status: AC
  Administered 2024-08-26: 1000 mg via ORAL

## 2024-08-26 MED ORDER — BUPIVACAINE-EPINEPHRINE 0.5% -1:200000 IJ SOLN
INTRAMUSCULAR | Status: DC | PRN
  Administered 2024-08-26: 20 mL

## 2024-08-26 MED ORDER — FENTANYL CITRATE (PF) 100 MCG/2ML IJ SOLN
INTRAMUSCULAR | Status: DC | PRN
  Administered 2024-08-26 (×2): 25 ug via INTRAVENOUS
  Administered 2024-08-26: 50 ug via INTRAVENOUS

## 2024-08-26 MED ORDER — FENTANYL CITRATE (PF) 100 MCG/2ML IJ SOLN
INTRAMUSCULAR | Status: AC
  Filled 2024-08-26: qty 2

## 2024-08-26 MED ORDER — ACETAMINOPHEN 500 MG PO TABS
ORAL_TABLET | ORAL | Status: AC
  Filled 2024-08-26: qty 2

## 2024-08-26 MED ORDER — OXYCODONE HCL 5 MG PO TABS: 5.0000 mg | ORAL_TABLET | Freq: Once | ORAL | Status: DC | PRN

## 2024-08-26 MED ORDER — KETOROLAC TROMETHAMINE 30 MG/ML IJ SOLN
INTRAMUSCULAR | Status: AC
  Filled 2024-08-26: qty 1

## 2024-08-26 MED ORDER — PROPOFOL 10 MG/ML IV BOLUS
INTRAVENOUS | Status: DC | PRN
  Administered 2024-08-26: 200 mg via INTRAVENOUS

## 2024-08-26 MED ORDER — OXYCODONE HCL 5 MG PO TABS: 5.0000 mg | ORAL_TABLET | Freq: Four times a day (QID) | ORAL | 0 refills | Status: AC | PRN

## 2024-08-26 MED ORDER — CHLORHEXIDINE GLUCONATE CLOTH 2 % EX PADS: 6.0000 | MEDICATED_PAD | Freq: Once | CUTANEOUS | Status: DC

## 2024-08-26 MED ORDER — 0.9 % SODIUM CHLORIDE (POUR BTL) OPTIME
TOPICAL | Status: DC | PRN
  Administered 2024-08-26: 500 mL

## 2024-08-26 MED ORDER — MEPERIDINE HCL 25 MG/ML IJ SOLN: 6.2500 mg | INTRAMUSCULAR | Status: DC | PRN

## 2024-08-26 MED ORDER — LACTATED RINGERS IV SOLN: INTRAVENOUS | Status: DC

## 2024-08-26 SURGICAL SUPPLY — 32 items
BINDER BREAST LRG (GAUZE/BANDAGES/DRESSINGS) IMPLANT
BLADE SURG 15 STRL LF DISP TIS (BLADE) ×1 IMPLANT
CANISTER SUCT 1200ML W/VALVE (MISCELLANEOUS) IMPLANT
CHLORAPREP W/TINT 26 (MISCELLANEOUS) ×1 IMPLANT
CLIP TI WIDE RED SMALL 6 (CLIP) IMPLANT
COVER BACK TABLE 60X90IN (DRAPES) ×1 IMPLANT
COVER MAYO STAND STRL (DRAPES) ×1 IMPLANT
DERMABOND ADVANCED .7 DNX12 (GAUZE/BANDAGES/DRESSINGS) ×1 IMPLANT
DRAPE LAPAROTOMY 100X72 PEDS (DRAPES) ×1 IMPLANT
DRAPE UTILITY XL STRL (DRAPES) ×1 IMPLANT
ELECTRODE REM PT RTRN 9FT ADLT (ELECTROSURGICAL) ×1 IMPLANT
GAUZE SPONGE 4X4 12PLY STRL LF (GAUZE/BANDAGES/DRESSINGS) ×1 IMPLANT
GLOVE SURG SIGNA 7.5 PF LTX (GLOVE) ×1 IMPLANT
GOWN STRL REUS W/ TWL LRG LVL3 (GOWN DISPOSABLE) ×1 IMPLANT
GOWN STRL REUS W/ TWL XL LVL3 (GOWN DISPOSABLE) ×1 IMPLANT
KIT MARKER MARGIN INK (KITS) ×1 IMPLANT
NDL HYPO 25X1 1.5 SAFETY (NEEDLE) ×1 IMPLANT
NEEDLE HYPO 25X1 1.5 SAFETY (NEEDLE) ×1 IMPLANT
NS IRRIG 1000ML POUR BTL (IV SOLUTION) ×1 IMPLANT
PACK BASIN DAY SURGERY FS (CUSTOM PROCEDURE TRAY) ×1 IMPLANT
PENCIL SMOKE EVACUATOR (MISCELLANEOUS) ×1 IMPLANT
SLEEVE SCD COMPRESS KNEE MED (STOCKING) IMPLANT
SPIKE FLUID TRANSFER (MISCELLANEOUS) IMPLANT
SPONGE T-LAP 4X18 ~~LOC~~+RFID (SPONGE) ×1 IMPLANT
SUT MNCRL AB 4-0 PS2 18 (SUTURE) ×1 IMPLANT
SUT SILK 2 0 SH (SUTURE) ×1 IMPLANT
SUT VIC AB 3-0 SH 27X BRD (SUTURE) ×1 IMPLANT
SYR CONTROL 10ML LL (SYRINGE) ×1 IMPLANT
TOWEL GREEN STERILE FF (TOWEL DISPOSABLE) ×1 IMPLANT
TRAY FAXITRON CT DISP (TRAY / TRAY PROCEDURE) IMPLANT
TUBE CONNECTING 20X1/4 (TUBING) IMPLANT
YANKAUER SUCT BULB TIP NO VENT (SUCTIONS) IMPLANT

## 2024-08-26 NOTE — Transfer of Care (Signed)
 Immediate Anesthesia Transfer of Care Note  Patient: Alexandra Huerta  Procedure(s) Performed: EXCISION, MASS, BREAST (Right: Breast)  Patient Location: PACU  Anesthesia Type:General  Level of Consciousness: awake and patient cooperative  Airway & Oxygen Therapy: Patient Spontanous Breathing and Patient connected to nasal cannula oxygen  Post-op Assessment: Report given to RN and Post -op Vital signs reviewed and stable  Post vital signs: Reviewed and stable  Last Vitals:  Vitals Value Taken Time  BP 106/66 08/26/24 11:04  Temp 36.5 C 08/26/24 11:04  Pulse 74 08/26/24 11:08  Resp 25 08/26/24 11:08  SpO2 100 % 08/26/24 11:08  Vitals shown include unfiled device data.  Last Pain:  Vitals:   08/26/24 1104  TempSrc:   PainSc: 0-No pain      Patients Stated Pain Goal: 4 (08/26/24 0945)  Complications: No notable events documented.

## 2024-08-26 NOTE — Anesthesia Procedure Notes (Addendum)
 Procedure Name: LMA Insertion Date/Time: 08/26/2024 10:18 AM  Performed by: Donnell Berwyn SQUIBB, CRNAPre-anesthesia Checklist: Patient identified, Emergency Drugs available, Suction available, Patient being monitored and Timeout performed Patient Re-evaluated:Patient Re-evaluated prior to induction Oxygen Delivery Method: Circle system utilized Preoxygenation: Pre-oxygenation with 100% oxygen Induction Type: IV induction Ventilation: Mask ventilation without difficulty LMA: LMA inserted LMA Size: 4.0 Placement Confirmation: positive ETCO2 and breath sounds checked- equal and bilateral Tube secured with: Tape Dental Injury: Teeth and Oropharynx as per pre-operative assessment

## 2024-08-26 NOTE — Interval H&P Note (Signed)
 History and Physical Interval Note: no change in H and P  08/26/2024 9:35 AM  Alexandra Huerta  has presented today for surgery, with the diagnosis of RIGHT BREAST MASS.  The various methods of treatment have been discussed with the patient and family. After consideration of risks, benefits and other options for treatment, the patient has consented to  Procedure(s) with comments: EXCISION, MASS, BREAST (Right) - LMA EXCISION RIGHT BREAST MASS as a surgical intervention.  The patient's history has been reviewed, patient examined, no change in status, stable for surgery.  I have reviewed the patient's chart and labs.  Questions were answered to the patient's satisfaction.     Vicenta Poli

## 2024-08-26 NOTE — Anesthesia Postprocedure Evaluation (Signed)
 Anesthesia Post Note  Patient: Alexandra Huerta  Procedure(s) Performed: EXCISION, MASS, BREAST (Right: Breast)     Patient location during evaluation: PACU Anesthesia Type: General Level of consciousness: awake and alert Pain management: pain level controlled Vital Signs Assessment: post-procedure vital signs reviewed and stable Respiratory status: spontaneous breathing, nonlabored ventilation and respiratory function stable Cardiovascular status: blood pressure returned to baseline and stable Postop Assessment: no apparent nausea or vomiting Anesthetic complications: no   No notable events documented.  Last Vitals:  Vitals:   08/26/24 1145 08/26/24 1206  BP: 109/71 (!) 119/90  Pulse: 66 67  Resp: 17 16  Temp:  (!) 36.3 C  SpO2: 94% 95%    Last Pain:  Vitals:   08/26/24 1206  TempSrc:   PainSc: 0-No pain                 Butler Levander Pinal

## 2024-08-26 NOTE — Op Note (Signed)
   Curlee Molt 08/26/2024   Pre-op Diagnosis: RIGHT BREAST MASS     Post-op Diagnosis: RIGHT BREAST MASS  Procedure(s): EXCISION RIGHT BREAST MASS  Surgeon(s): Vernetta Berg, MD  Anesthesia: General  Staff:  Circulator: Wilmon Antonio SQUIBB, RN Scrub Person: Lelon Daphne BROCKS, RN Circulator Assistant: Eliberto Geroge HERO, RN  Estimated Blood Loss: Minimal               Specimens: sent to path  Indications: This is a 33 year old female with an enlarging right breast mass in the upper outer quadrant.  Imaging in the past appeared more consistent with a fibroadenoma.  It is now been enlarging.  Biopsy showed a complex fibroadenoma.  Surgical excision was recommended to rule out a sarcoma of the breast  Procedure: The patient was brought to the operating room identified the correct patient.  She was placed upon the operating table and general anesthesia was induced.  Her right breast was prepped and draped in the usual sterile fashion.  The palpable mass was in the upper outer quadrant of the right breast approximately 6 cm from the nipple.  I anesthetized the lateral edge of the areola with Marcaine  and made incision with a scalpel.  I then dissected laterally toward the palpable mass.  She had fairly dense breast tissue dissecting toward that area.  I was able to easily move the mass around but it was puckering in the skin at 1 place and freeing up this area created a second opening in the skin.  I was then able to better identify the mass and excised completely with the electrocautery.  It did appear  consistent with a fibroadenoma.  Once the mass was completely removed it was sent to pathology for evaluation.  I then achieved hemostasis with the cautery.  I anesthetized the area further with Marcaine .  I then closed the circumareolar incision with interrupted 3-0 Vicryl sutures and a running 4-0 Monocryl.  I closed the other opening in the skin with interrupted 4-0 Monocryl sutures.  Dermabond  was then applied.  The patient tolerated the procedure well.  All the counts were correct at the end of the procedure.  She was placed in a breast binder and then extubated in the operating room and taken in stable condition to the recovery room.          Berg Vernetta   Date: 08/26/2024  Time: 10:54 AM

## 2024-08-26 NOTE — Anesthesia Preprocedure Evaluation (Addendum)
 Anesthesia Evaluation  Patient identified by MRN, date of birth, ID band Patient awake    Reviewed: Allergy & Precautions, NPO status , Patient's Chart, lab work & pertinent test results  History of Anesthesia Complications Negative for: history of anesthetic complications  Airway Mallampati: I  TM Distance: >3 FB Neck ROM: Full    Dental  (+) Dental Advisory Given, Teeth Intact   Pulmonary former smoker   Pulmonary exam normal        Cardiovascular negative cardio ROS Normal cardiovascular exam     Neuro/Psych  Headaches  negative psych ROS   GI/Hepatic negative GI ROS, Neg liver ROS,,,  Endo/Other  negative endocrine ROS    Renal/GU negative Renal ROS     Musculoskeletal negative musculoskeletal ROS (+)    Abdominal   Peds  Hematology negative hematology ROS (+)   Anesthesia Other Findings   Reproductive/Obstetrics                              Anesthesia Physical Anesthesia Plan  ASA: 2  Anesthesia Plan: General   Post-op Pain Management:    Induction: Intravenous  PONV Risk Score and Plan: 3 and Treatment may vary due to age or medical condition, Ondansetron , Dexamethasone  and Midazolam   Airway Management Planned: LMA  Additional Equipment: None  Intra-op Plan:   Post-operative Plan: Extubation in OR  Informed Consent: I have reviewed the patients History and Physical, chart, labs and discussed the procedure including the risks, benefits and alternatives for the proposed anesthesia with the patient or authorized representative who has indicated his/her understanding and acceptance.     Dental advisory given  Plan Discussed with: CRNA and Anesthesiologist  Anesthesia Plan Comments:         Anesthesia Quick Evaluation

## 2024-08-26 NOTE — Discharge Instructions (Addendum)
 [facility] Office Phone Number [phone]  BREAST BIOPSY/ PARTIAL MASTECTOMY: POST OP INSTRUCTIONS  Always review your discharge instruction sheet given to you by the facility where your surgery was performed.  IF YOU HAVE DISABILITY OR FAMILY LEAVE FORMS, YOU MUST BRING THEM TO THE OFFICE FOR PROCESSING.  DO NOT GIVE THEM TO YOUR DOCTOR.  A prescription for pain medication may be given to you upon discharge.  Take your pain medication as prescribed, if needed.  If narcotic pain medicine is not needed, then you may take acetaminophen  (Tylenol ) or ibuprofen  (Advil ) as needed. Take your usually prescribed medications unless otherwise directed If you need a refill on your pain medication, please contact your pharmacy.  They will contact our office to request authorization.  Prescriptions will not be filled after 5pm or on week-ends. You should eat very light the first 24 hours after surgery, such as soup, crackers, pudding, etc.  Resume your normal diet the day after surgery. Most patients will experience some swelling and bruising in the breast.  Ice packs and a good support bra will help.  Swelling and bruising can take several days to resolve.  It is common to experience some constipation if taking pain medication after surgery.  Increasing fluid intake and taking a stool softener will usually help or prevent this problem from occurring.  A mild laxative (Milk of Magnesia or Miralax) should be taken according to package directions if there are no bowel movements after 48 hours. Unless discharge instructions indicate otherwise, you may remove your bandages 24-48 hours after surgery, and you may shower at that time.  You may have steri-strips (small skin tapes) in place directly over the incision.  These strips should be left on the skin for 7-10 days.  If your surgeon used skin glue on the incision, you may shower in 24 hours.  The glue will flake off over the next 2-3 weeks.  Any  sutures or staples will be removed at the office during your follow-up visit. ACTIVITIES:  You may resume regular daily activities (gradually increasing) beginning the next day.  Wearing a good support bra or sports bra minimizes pain and swelling.  You may have sexual intercourse when it is comfortable. You may drive when you no longer are taking prescription pain medication, you can comfortably wear a seatbelt, and you can safely maneuver your car and apply brakes. RETURN TO WORK:  ______________________________________________________________________________________ Alexandra Huerta should see your doctor in the office for a follow-up appointment approximately two weeks after your surgery.  Your doctor's nurse will typically make your follow-up appointment when she calls you with your pathology report.  Expect your pathology report 2-3 business days after your surgery.  You may call to check if you do not hear from us  after three days. OTHER INSTRUCTIONS: YOU MAY REMOVE THE BINDER AND SHOWER STARTING TOMORROW ICE PACK, TYLENOL , AND IBUPROFEN  ALSO FOR PAIN NO VIGOROUS ACTIVITY FOR ONE WEEK _____________________________________________________________________________________________________________________________________ _____________________________________________________________________________________________________________________________________ _____________________________________________________________________________________________________________________________________  WHEN TO CALL YOUR DOCTOR: Fever over 101.0 Nausea and/or vomiting. Extreme swelling or bruising. Continued bleeding from incision. Increased pain, redness, or drainage from the incision.  The clinic staff is available to answer your questions during regular business hours.  Please don't hesitate to call and ask to speak to one of the nurses for clinical concerns.  If you have a medical emergency, go to the nearest emergency room  or call 911.  A surgeon from [facility] is always on call at the hospital.  For further  questions, please visit [email or web address]  Post Anesthesia Home Care Instructions  Activity: Get plenty of rest for the remainder of the day. A responsible individual must stay with you for 24 hours following the procedure.  For the next 24 hours, DO NOT: -Drive a car -Advertising copywriter -Drink alcoholic beverages -Take any medication unless instructed by your physician -Make any legal decisions or sign important papers.  Meals: Start with liquid foods such as gelatin or soup. Progress to regular foods as tolerated. Avoid greasy, spicy, heavy foods. If nausea and/or vomiting occur, drink only clear liquids until the nausea and/or vomiting subsides. Call your physician if vomiting continues.  Special Instructions/Symptoms: Your throat may feel dry or sore from the anesthesia or the breathing tube placed in your throat during surgery. If this causes discomfort, gargle with warm salt water. The discomfort should disappear within 24 hours.  If you had a scopolamine  patch placed behind your ear for the management of post- operative nausea and/or vomiting:  1. The medication in the patch is effective for 72 hours, after which it should be removed.  Wrap patch in a tissue and discard in the trash. Wash hands thoroughly with soap and water. 2. You may remove the patch earlier than 72 hours if you experience unpleasant side effects which may include dry mouth, dizziness or visual disturbances. 3. Avoid touching the patch. Wash your hands with soap and water after contact with the patch.      Next dose of tylenol  may be taken at 4p Next dose of NSAIDs (Ibuprofen /Motrin Jeronimo) may be given at 7pm if needed.

## 2024-08-27 LAB — SURGICAL PATHOLOGY
